# Patient Record
Sex: Female | Born: 1935
Health system: Southern US, Community
[De-identification: ages and names within clinical notes are randomized; demographics above are authoritative.]

## PROBLEM LIST (undated history)

## (undated) DIAGNOSIS — I10 Essential (primary) hypertension: Secondary | ICD-10-CM

## (undated) DIAGNOSIS — N189 Chronic kidney disease, unspecified: Secondary | ICD-10-CM

## (undated) DIAGNOSIS — D649 Anemia, unspecified: Secondary | ICD-10-CM

## (undated) DIAGNOSIS — E119 Type 2 diabetes mellitus without complications: Secondary | ICD-10-CM

## (undated) DIAGNOSIS — M109 Gout, unspecified: Secondary | ICD-10-CM

## (undated) DIAGNOSIS — K219 Gastro-esophageal reflux disease without esophagitis: Secondary | ICD-10-CM

## (undated) HISTORY — DX: Chronic kidney disease, unspecified: N18.9

## (undated) HISTORY — DX: Essential (primary) hypertension: I10

## (undated) HISTORY — DX: Gout, unspecified: M10.9

## (undated) HISTORY — PX: ROTATOR CUFF REPAIR: SHX139

## (undated) HISTORY — DX: Type 2 diabetes mellitus without complications: E11.9

## (undated) HISTORY — DX: Anemia, unspecified: D64.9

## (undated) HISTORY — PX: VAGINAL HYSTERECTOMY: SUR661

## (undated) HISTORY — DX: Gastro-esophageal reflux disease without esophagitis: K21.9

---

## 2005-08-25 ENCOUNTER — Ambulatory Visit: Payer: Self-pay | Admitting: Family Medicine

## 2006-06-20 ENCOUNTER — Emergency Department: Payer: Self-pay | Admitting: Emergency Medicine

## 2006-10-04 ENCOUNTER — Ambulatory Visit: Payer: Self-pay | Admitting: Family Medicine

## 2007-04-25 ENCOUNTER — Other Ambulatory Visit: Payer: Self-pay

## 2007-04-25 ENCOUNTER — Ambulatory Visit: Payer: Self-pay | Admitting: Ophthalmology

## 2007-05-02 ENCOUNTER — Ambulatory Visit: Payer: Self-pay | Admitting: Ophthalmology

## 2007-10-10 ENCOUNTER — Ambulatory Visit: Payer: Self-pay | Admitting: Family Medicine

## 2007-12-15 ENCOUNTER — Ambulatory Visit: Payer: Self-pay | Admitting: Internal Medicine

## 2007-12-19 ENCOUNTER — Ambulatory Visit: Payer: Self-pay | Admitting: Family Medicine

## 2007-12-20 ENCOUNTER — Ambulatory Visit: Payer: Self-pay | Admitting: Family Medicine

## 2007-12-22 ENCOUNTER — Ambulatory Visit: Payer: Self-pay | Admitting: Family Medicine

## 2008-01-23 ENCOUNTER — Ambulatory Visit: Payer: Self-pay | Admitting: Unknown Physician Specialty

## 2008-03-07 ENCOUNTER — Ambulatory Visit: Payer: Self-pay | Admitting: Unknown Physician Specialty

## 2008-03-15 ENCOUNTER — Ambulatory Visit: Payer: Self-pay | Admitting: Unknown Physician Specialty

## 2008-04-30 ENCOUNTER — Ambulatory Visit: Payer: Self-pay | Admitting: Family Medicine

## 2008-10-11 ENCOUNTER — Ambulatory Visit: Payer: Self-pay | Admitting: Family Medicine

## 2009-04-18 ENCOUNTER — Ambulatory Visit: Payer: Self-pay | Admitting: Family Medicine

## 2009-10-14 ENCOUNTER — Ambulatory Visit: Payer: Self-pay | Admitting: Family Medicine

## 2010-10-16 ENCOUNTER — Ambulatory Visit: Payer: Self-pay | Admitting: Family Medicine

## 2011-02-24 ENCOUNTER — Ambulatory Visit: Payer: Self-pay | Admitting: Unknown Physician Specialty

## 2011-10-20 ENCOUNTER — Ambulatory Visit: Payer: Self-pay | Admitting: Family Medicine

## 2012-10-22 ENCOUNTER — Inpatient Hospital Stay: Payer: Self-pay | Admitting: Internal Medicine

## 2012-10-22 LAB — CK TOTAL AND CKMB (NOT AT ARMC)
CK, Total: 108 U/L (ref 21–215)
CK-MB: 2 ng/mL (ref 0.5–3.6)

## 2012-10-22 LAB — CBC WITH DIFFERENTIAL/PLATELET
Basophil %: 0.5 %
Eosinophil #: 0.2 10*3/uL (ref 0.0–0.7)
HCT: 30.4 % — ABNORMAL LOW (ref 35.0–47.0)
HGB: 10.2 g/dL — ABNORMAL LOW (ref 12.0–16.0)
MCH: 29.9 pg (ref 26.0–34.0)
MCHC: 33.5 g/dL (ref 32.0–36.0)
MCV: 89 fL (ref 80–100)
Monocyte #: 0.9 x10 3/mm (ref 0.2–0.9)
Monocyte %: 8.3 %
Platelet: 197 10*3/uL (ref 150–440)
RDW: 13.5 % (ref 11.5–14.5)
WBC: 10.2 10*3/uL (ref 3.6–11.0)

## 2012-10-22 LAB — CBC
MCH: 29.7 pg (ref 26.0–34.0)
MCHC: 32.8 g/dL (ref 32.0–36.0)
Platelet: 190 10*3/uL (ref 150–440)
RBC: 3.89 10*6/uL (ref 3.80–5.20)
WBC: 9.7 10*3/uL (ref 3.6–11.0)

## 2012-10-22 LAB — BASIC METABOLIC PANEL
BUN: 28 mg/dL — ABNORMAL HIGH (ref 7–18)
Co2: 28 mmol/L (ref 21–32)
EGFR (African American): 27 — ABNORMAL LOW
Osmolality: 281 (ref 275–301)
Potassium: 3.6 mmol/L (ref 3.5–5.1)
Sodium: 137 mmol/L (ref 136–145)

## 2012-10-22 LAB — URINALYSIS, COMPLETE
Glucose,UR: NEGATIVE mg/dL (ref 0–75)
Hyaline Cast: 17
Nitrite: NEGATIVE
Protein: 30
RBC,UR: 13 /HPF (ref 0–5)
Specific Gravity: 1.025 (ref 1.003–1.030)
WBC UR: 17 /HPF (ref 0–5)

## 2012-10-22 LAB — APTT: Activated PTT: >160 secs (ref 23.6–35.9)

## 2012-10-22 LAB — COMPREHENSIVE METABOLIC PANEL
Alkaline Phosphatase: 78 U/L (ref 50–136)
Bilirubin,Total: 0.3 mg/dL (ref 0.2–1.0)
Calcium, Total: 10.1 mg/dL (ref 8.5–10.1)
Chloride: 105 mmol/L (ref 98–107)
Co2: 25 mmol/L (ref 21–32)
Creatinine: 1.96 mg/dL — ABNORMAL HIGH (ref 0.60–1.30)
EGFR (African American): 27 — ABNORMAL LOW
Osmolality: 285 (ref 275–301)
SGOT(AST): 24 U/L (ref 15–37)
SGPT (ALT): 18 U/L (ref 12–78)

## 2012-10-22 LAB — TROPONIN I: Troponin-I: 0.37 ng/mL — ABNORMAL HIGH

## 2012-10-22 LAB — MAGNESIUM: Magnesium: 1.4 mg/dL — ABNORMAL LOW

## 2012-10-23 LAB — CK TOTAL AND CKMB (NOT AT ARMC)
CK, Total: 165 U/L (ref 21–215)
CK-MB: 1.2 ng/mL (ref 0.5–3.6)

## 2012-10-23 LAB — APTT: Activated PTT: 83.9 secs — ABNORMAL HIGH (ref 23.6–35.9)

## 2012-10-23 LAB — TROPONIN I: Troponin-I: 0.54 ng/mL — ABNORMAL HIGH

## 2012-10-24 LAB — BASIC METABOLIC PANEL WITH GFR
Anion Gap: 9 (ref 7–16)
BUN: 30 mg/dL — ABNORMAL HIGH (ref 7–18)
Calcium, Total: 8.8 mg/dL (ref 8.5–10.1)
Chloride: 104 mmol/L (ref 98–107)
Co2: 25 mmol/L (ref 21–32)
Creatinine: 1.91 mg/dL — ABNORMAL HIGH (ref 0.60–1.30)
EGFR (African American): 28 — ABNORMAL LOW
EGFR (Non-African Amer.): 24 — ABNORMAL LOW
Glucose: 143 mg/dL — ABNORMAL HIGH (ref 65–99)
Osmolality: 284 (ref 275–301)
Potassium: 4.7 mmol/L (ref 3.5–5.1)
Sodium: 138 mmol/L (ref 136–145)

## 2012-10-24 LAB — PLATELET COUNT: Platelet: 183 x10 3/mm 3 (ref 150–440)

## 2012-10-24 LAB — APTT: Activated PTT: 86.1 secs — ABNORMAL HIGH (ref 23.6–35.9)

## 2012-10-24 LAB — URINE CULTURE

## 2012-10-28 LAB — CULTURE, BLOOD (SINGLE)

## 2014-06-21 DIAGNOSIS — E119 Type 2 diabetes mellitus without complications: Secondary | ICD-10-CM | POA: Insufficient documentation

## 2014-12-07 NOTE — Discharge Summary (Signed)
PATIENT NAME:  Laura [name] MR#:  [id number] DATE OF BIRTH:  1927/09/13  DATE OF ADMISSION:  10/22/2012 DATE OF DISCHARGE:  10/24/2012  DISCHARGE DIAGNOSES: Syncopal episode. Mild elevated troponin. No ischemia. Nuclear medicine stress test negative. Mild reduced ejection fraction as per stress test. No acute failure. Urinary tract infection.   PRIMARY CARDIOLOGIST: Laura MillardAlexander Paraschos, MD  HISTORY OF PRESENT ILLNESS: The patient is an 81 year old African American female with past medical history of hypertension, hyperlipidemia, diabetes, osteoarthritis, cataract surgery, rotator cuff repair, presented via EMS by having a brief syncopal episode, found to have troponin level of 0.37 in Emergency Room as per the patient, fully awake and able to give full history on presentation. In the morning, she woke up with mild headache. [facility], felt a little nauseated, and having first bite of her breakfast had a short vomiting episode and passed out for a few seconds. Did not have any presyncopal episode like this before. No blurry vision. No shortness of breath, chest pain, chest tightness. In ER, she was found having some inverted T waves in V1 to V2. Troponin was mildly elevated at 0.37 and she was admitted for further inpatient workup and management for her troponin.   HOSPITAL COURSE AND STAY: Because of her borderline elevated troponin and significant history, cardiology consult was called in. Cardiologist suggested stress test to be done next day morning. Her stress test came out negative and her troponin level remained stable, so she was discharged home with no additional medication. Other significant medical issues addressed during this hospital stay:  1.  Urinary tract infection: She was started on Rocephin and continued with that, remained stable.  2.  Syncopal episode: Possibly vasovagal or due to cardiac event. Her troponins were borderline elevated so  she was started on heparin drip, but then after having negative stress test, she was discharged home without any medications and attributed the syncopal episode to vasovagal event.  3.  Hypertension: Continued on metoprolol.  4.  Hyperlipidemia: Continued statin.  5.  Chronic kidney disease: No previous level available to compare over here. Heparin drip was continued and her BMP checked in the morning, which remained almost the same level. She said that she is following with a nephrologist for her kidney problems. 6.  Diabetes: Continued on sliding scale insulin coverage.   IMPORTANT LABORATORY RESULTS: Urinalysis: 17 WBC, 2+ lymphocyte esterase. Total WBC count 9.7, hemoglobin 11.6. Creatinine 1.96 on presentation. Troponin was 0.37 on presentation, which went to 0.29. Creatinine came to 1.91 after followup. Nuclear medicine stress test negative.   CONDITION ON DISCHARGE: Stable.   CODE STATUS ON DISCHARGE: Full code.   DISCHARGE MEDICATIONS: Estropipate 0.75 mg oral tablet once a day, ferrous sulfate 325 mg oral tablet once a day, ranitidine 300 mg oral tablet once a day, glipizide 10 mg oral tablet extended-release once a day, Mobic 15 mg oral tablet once a day, Januvia 100 mg oral tablet once a day, atorvastatin 10 mg oral tablet once a day, metoprolol 25 mg oral tablet 2 times a day, Ceftin 500 mg oral tablet 2 times a day for 3 days and amlodipine 10 mg once a day.   HOME HEALTH ON DISCHARGE: No.   DIET ON DISCHARGE: Low sodium, carbohydrate controlled ADA diet. Diet consistency regular.   TIMEFRAME TO FOLLOWUP: In 1 to 2 weeks with Dr. Marcina MillardAlexander [name].  TIME SPENT ON THIS DISCHARGE: 45 minutes.    ____________________________ Laura PigeonVaibhavkumar G.  Elisabeth Pigeon, MD vgv:jm D: 10/28/2012 22:53:55 ET T: 10/29/2012 14:57:34 ET JOB#: [id number]  cc: Laura [name]. Elisabeth Pigeon, MD, <Dictator> Laura Millard, MD Altamese Dilling MD ELECTRONICALLY SIGNED 11/14/2012 9:28

## 2014-12-07 NOTE — Consult Note (Signed)
Brief Consult Note: Diagnosis: Syncope, probable vasovagal, borderline elevated trop, no CP, probable demand supply ischemia.   Patient was seen by consultant.   Consult note dictated.   Comments: REC  Agree with current therapy, echo, Lexi-sest in am.  Electronic Signatures: Marcina MillardParaschos, Alexander (MD)  (Signed 09-Mar-14 13:29)  Authored: Brief Consult Note   Last Updated: 09-Mar-14 13:29 by Marcina MillardParaschos, Alexander (MD)

## 2014-12-07 NOTE — Consult Note (Signed)
PATIENT NAME:  Laura [name], Laura DibbleJOSEPHINE C MR#:  [id number] DATE OF BIRTH:  07/30/1928  DATE OF CONSULTATION:  10/23/2012  REFERRING PHYSICIAN:  Stephanie AcreVishal Mungal, MD  CONSULTING PHYSICIAN:  Marcina MillardAlexander Paraschos, MD PRIMARY CARE PHYSICIAN: Elizabeth Sauereanna Jones, MD      CHIEF COMPLAINT: "I passed out."   REASON FOR CONSULTATION: Consultation requested for evaluation of syncope and elevated troponin.     HISTORY OF PRESENT ILLNESS: The patient is an 81 year old female with history of hypertension who was admitted following a syncopal episode. The patient reports that she was in her usual state of health until yesterday while eating lunch the patient became slightly nauseous, diaphoretic and experienced an episode of loss of consciousness. The [facility] was [facility] to [facility] Emergency Room where EKG was nondiagnostic. Admission labs were notable for elevated BUN and creatinine at 26 and 1.96, respectively. Troponin was borderline elevated to 0.37. Urinalysis is positive for a urinary tract infection.   PAST MEDICAL HISTORY: 1.  Hypertension.  2.  Diabetes.  3.  Osteoarthritis.     HOME MEDICATIONS: Glipizide 10 mg daily, Januvia 100 mg daily, hydrochlorothiazide/lisinopril 12.5/10 daily, amlodipine 10 mg daily, ranitidine 300 mg daily, ferrous sulfate 325 mg daily.   SOCIAL HISTORY: The patient is a widow. She currently lives alone. She denies tobacco abuse.   FAMILY HISTORY: Mother with a history of heart disease.   REVIEW OF SYSTEMS:   CONSTITUTIONAL: No fever or chills.  EYES: No blurry vision.  EARS: No hearing loss.  RESPIRATORY: No shortness of breath.  CARDIOVASCULAR: No chest pain. The patient did have syncopal episode as described above.  GASTROINTESTINAL: The patient had nausea prior to syncopal episode, denies constipation,  diarrhea.  GENITOURINARY: No dysuria or hematuria.  ENDOCRINE: No polyuria or polydipsia.  MUSCULOSKELETAL: The patient does have bilateral knee pain.  NEUROLOGICAL: The patient  denies focal muscle weakness or numbness.  PSYCHOLOGICAL: No depression or anxiety.   PHYSICAL EXAMINATION: VITAL SIGNS: Blood pressure 120/61, pulse 62, respirations 16, temperature 98.2, pulse oximetry 100%.  HEENT: Pupils are equal, reactive to light and accommodation.  NECK: Supple without thyromegaly.  LUNGS: Clear.  HEART: Normal JVP. Normal PMI. Regular rate and rhythm. Normal S1, S2. No appreciable gallop, murmur, or rub.  ABDOMEN: Soft and nontender. Pulses were intact bilaterally.  MUSCULOSKELETAL: Normal muscle tone.  NEUROLOGICAL: The patient is alert and oriented x 3. Motor and sensory are both grossly intact.   IMPRESSION: An 81 year old female who presents with syncopal episode which sounds very much like vasovagal syncope with preceding nausea, lightheadedness, diaphoresis. The patient has remained hemodynamically stable, EKG is nondiagnostic. The patient has borderline elevated troponin in the setting of chronic renal insufficiency and probable UTI. I suspect this is probable demand/supply ischemia in the absence of chest pain or ECG changes.   RECOMMENDATIONS: 1.  I agree with overall current therapy.  2.  Continue heparin drip for now.  3.  A 2-D echocardiogram to evaluate left ventricular function.       4.  Lexiscan sestamibi study in the a.m.  ____________________________ Marcina MillardAlexander Paraschos, MD ap:cb D: 10/23/2012 13:26:59 ET T: 10/23/2012 13:36:04 ET JOB#: [id number]  cc: Marcina MillardAlexander Paraschos, MD, <Dictator> Marcina MillardALEXANDER PARASCHOS MD ELECTRONICALLY SIGNED 11/16/2012 12:25

## 2014-12-07 NOTE — H&P (Signed)
Past Med/Surgical Hx:  Diabetes Mellitus, Type II (NIDD):   Hypertension:   ALLERGIES:  No Known Allergies:    Assessment/Admission Diagnosis 81 yo female with PMhx of DM, HTN, HLD, OA, presented with syncopal episode and NSTEMI  1. NSTEMI - troponin x 1 =0.37 - cont with medical management (asa, beta blocker, statin, nitro SL, prn O2) - telemetry, CEx3, cont with heparin gtt - monitor blood pressure - cardiology consult  2. Vomiting - prn zofran  3. Syncope - first event, possible vasovagal  - check orthostatics  4. HTN   5. HLD  6. ? CKD - no previous labs to compare Cr to, currently on heparin gtt in D5W, will check BMP in the AM., and monitor creatinine.  7. DM - ssi  8. Home Meds - patient could not remember her home meds, advised her to have someone bring in her home meds.    FULL CODE PMD - Dr. Yetta BarreJones  Time spent evaluating patient = 45 minutes [id number]#[id number]   Electronic Signatures: Stephanie AcreMungal, Vishal (MD)  (Signed 08-Mar-14 17:15)  Authored: PAST MEDICAL/SURGIAL HISTORY, ALLERGIES, HOME MEDICATIONS, ASSESSMENT AND PLAN   Last Updated: 08-Mar-14 17:15 by Stephanie AcreMungal, Vishal (MD)

## 2014-12-07 NOTE — H&P (Signed)
PATIENT NAME:  [name], Laura DibbleJOSEPHINE [name] MR#:  [id number] DATE OF BIRTH:  [birth year]  DATE OF ADMISSION:  10/22/2012  INDICATION FOR ADMISSION: Non-STEMI.  CHIEF COMPLAINT: "I vomited and passed out."  ADMITTING PHYSICIAN: Dr. Stephanie AcreVishal Adryan [name].  PRIMARY CARE PHYSICIAN: Dr. Yetta [name].   EMERGENCY ROOM PHYSICIAN: Dr. Dellis AnesGallagher.   HISTORY OF PRESENT ILLNESS: The patient is an 81 year old African American female with past medical history of hypertension, hyperlipidemia, diabetes, osteoarthritis, history of cataract surgery, rotator cuff repair, [facility] short brief episode of syncope, found to have a troponin bump of 0.37 in the ED. History is per the patient. She is fully awake and able to give a full history. The patient states that this morning, she woke up with a mild headache. [facility] to [facility]. She felt a little nauseous after having the first bite of her breakfast. She had a short vomiting episode and then passed out for a few seconds. She said that she did not have any presyncopal episodes like this before. She did not have any blurry vision or have any shortness of breath, chest pain, chest tightness associated with this. She said the syncopal episode lasted for a few minutes. In the ED, she was found to have some flipped T waves in V1 through V2 on 2 separate EKGs. Her troponin was mildly elevated at 0.37. She was admitted for further inpatient workup and management of her troponin bump. The patient states she had [facility]. She cannot remember when. She states that she follows with a kidney doctor because she has some kidney disease. However, she is not on any form of dialysis. Hospitalist services were consulted for further inpatient workup and management. Her primary care physician is Dr. Yetta [name].   PAST MEDICAL HISTORY: Hypertension, diabetes, questionable CKD, osteoarthritis.  PAST SURGICAL  HISTORY: Cataract surgery, left rotator cuff repair a number of years ago.  HOME MEDICATIONS: She did not bring her medications with her and cannot remember.  ALLERGIES: No known drug allergies.   FAMILY HISTORY: Mother had heart disease and died.   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   REVIEW OF SYSTEMS: CONSTITUTIONAL: No fever, fatigue, weakness. No chest pain. No weight loss or weight gain.  EYES: No blurred vision, double vision, pain, redness, inflammation. Positive cataracts.  ENT: No tinnitus, ear pain, hearing loss. No seasonal allergies.  RESPIRATORY: No cough, wheeze, hemoptysis, COPD, asthma, TB, pneumonia.   CARDIOVASCULAR: No chest pain, orthopnea, edema, arrhythmia, dyspnea on exertion.  GASTROINTESTINAL: Nausea and vomiting. No diarrhea. No abdominal pain, hematemesis, melena or ulcers.  GENITOURINARY: No dysuria, hematuria or renal calculi.  ENDOCRINE: No polyuria, nocturia or thyroid problems.  HEMATOLOGIC AND LYMPHATIC: No anemia, easy bruising, bleeding or swollen glands.  INTEGUMENTARY: No acne, rash, change in lesions, moles or skin.  MUSCULOSKELETAL: No acute pain in neck, back, shoulder, knee, hip arthritis. Positive numbness.  NEUROLOGIC: No weakness, dysarthria, epilepsy, tremor or vertigo.  PSYCHIATRIC: No anxiety, insomnia, ADD, OCD, bipolar or depression.   PHYSICAL EXAMINATION:  VITAL SIGNS: In the ED, blood pressure 142/53, temperature 98.2, pulse ox 100% on room air, pulse 81.  GENERAL APPEARANCE: Well-developed, well-nourished female lying in bed in no acute respiratory distress.  HEENT: PERRLA, EOMI. No scleral icterus or conjunctivitis. No difficulty hearing.  NECK: No thyroid enlargement, nodules or  tenderness. Neck is supple and nontender.  RESPIRATORY: Clear to auscultation bilaterally. No rales, rhonchi, crackles or diminished breath sounds. No wheezes noted.   CARDIOVASCULAR: Regular rate and regular rhythm. No murmurs. S1, S2 auscultated. No PMI lateralization. No lower extremity edema.  ABDOMEN: Soft, nontender, nondistended. Positive bowel sounds.  MUSCULOSKELETAL: 4/5 strength bilateral upper and lower extremities. No cyanosis or DJD.  SKIN: No rash, lesions, erythema, nodules. Skin is warm and dry.  LYMPHATIC: No adenopathy noted in cervical, axilla or supraclavicular regions.  NEUROLOGIC: Cranial nerves II through XII intact. Deep tendon reflexes intact. Sensory is intact.  PSYCHIATRIC: Alert and oriented to person, time and place. Cooperative. Good judgment is noted.   PERTINENT LABORATORY, DIAGNOSTIC AND RADIOLOGIC DATA: Sodium 137, potassium 3.9, chloride 105, bicarbonate 25, BUN 26, creatinine 1.96, glucose 208. Troponin is elevated at 0.37. She did have a urinalysis that showed 1+ bacteria, 17 white blood cells, nitrite negative, 2+ leukocyte esterase, specific gravity of 1.025. X-ray showed no acute cardiopulmonary process. EKG shows normal sinus rhythm, rate of about 75 to 80, inverted T waves in V1, 2 and 3. Repeat EKG 4 hours later showed no acute changes. The patient does not complain of chest pain.   ASSESSMENT AND PLAN: An 81 year old female with past medical history of diabetes, hypertension, hyperlipidemia, osteoarthritis, presenting with syncopal episode, found to have a non-ST elevation myocardial infarction.  1.  Non-ST elevation myocardial infarction. Troponin x 1 is 0.37. Continue medical management with aspirin, beta blocker, statin, nitroglycerin sublingual, and as-needed oxygen. Telemetry floor. Check cardiac enzymes x 3. Continue with the heparin drip started by the Emergency Department. Monitor her blood pressure. Will also consult cardiology in the morning. I believe this is more likely a demand ischemia. She said her blood pressure has been running low to high over the past couple days. In the Emergency Department, her blood  pressure was noted to be moderately elevated into the 140s to 150s.  2.  Vomiting secondary to #1. Will put her on as-needed Zofran.  3.  Syncope. This is a first event. She does not remember syncopizing in the past before. This is possibly vasovagal or related to the non-ST elevation myocardial infarction. Will check orthostatics also.  4.  Hypertension. Continue with home medications once we get the list available.  5.  Hyperlipidemia. Continue with statin therapy.   CODE STATUS: Full code.   TIME SPENT DICTATING AND EVALUATING PATIENT: 45 minutes.    ____________________________ Stephanie Acre, MD vm:jm D: 10/22/2012 17:10:33 ET T: 10/22/2012 17:55:45 ET JOB#: [id number]  cc: Stephanie Acre, MD, <Dictator> Stephanie Acre MD ELECTRONICALLY SIGNED 10/22/2012 23:50

## 2015-02-20 ENCOUNTER — Other Ambulatory Visit: Payer: Self-pay | Admitting: Family Medicine

## 2015-02-20 DIAGNOSIS — I10 Essential (primary) hypertension: Secondary | ICD-10-CM

## 2015-02-21 ENCOUNTER — Ambulatory Visit (INDEPENDENT_AMBULATORY_CARE_PROVIDER_SITE_OTHER): Payer: Medicare Other | Admitting: Family Medicine

## 2015-02-21 ENCOUNTER — Encounter: Payer: Self-pay | Admitting: Family Medicine

## 2015-02-21 VITALS — BP 120/60 | HR 60 | Ht 62.0 in | Wt 151.0 lb

## 2015-02-21 DIAGNOSIS — D649 Anemia, unspecified: Secondary | ICD-10-CM | POA: Insufficient documentation

## 2015-02-21 DIAGNOSIS — I10 Essential (primary) hypertension: Secondary | ICD-10-CM | POA: Insufficient documentation

## 2015-02-21 DIAGNOSIS — Z8669 Personal history of other diseases of the nervous system and sense organs: Secondary | ICD-10-CM | POA: Insufficient documentation

## 2015-02-21 DIAGNOSIS — Z09 Encounter for follow-up examination after completed treatment for conditions other than malignant neoplasm: Secondary | ICD-10-CM | POA: Insufficient documentation

## 2015-02-21 DIAGNOSIS — K219 Gastro-esophageal reflux disease without esophagitis: Secondary | ICD-10-CM | POA: Insufficient documentation

## 2015-02-21 DIAGNOSIS — Z23 Encounter for immunization: Secondary | ICD-10-CM | POA: Insufficient documentation

## 2015-02-21 DIAGNOSIS — L659 Nonscarring hair loss, unspecified: Secondary | ICD-10-CM

## 2015-02-21 DIAGNOSIS — E119 Type 2 diabetes mellitus without complications: Secondary | ICD-10-CM | POA: Insufficient documentation

## 2015-02-21 DIAGNOSIS — T783XXA Angioneurotic edema, initial encounter: Secondary | ICD-10-CM | POA: Insufficient documentation

## 2015-02-21 DIAGNOSIS — Z780 Asymptomatic menopausal state: Secondary | ICD-10-CM | POA: Insufficient documentation

## 2015-02-21 DIAGNOSIS — E7849 Other hyperlipidemia: Secondary | ICD-10-CM | POA: Insufficient documentation

## 2015-02-21 DIAGNOSIS — R55 Syncope and collapse: Secondary | ICD-10-CM | POA: Insufficient documentation

## 2015-02-21 DIAGNOSIS — M109 Gout, unspecified: Secondary | ICD-10-CM | POA: Insufficient documentation

## 2015-02-21 DIAGNOSIS — I872 Venous insufficiency (chronic) (peripheral): Secondary | ICD-10-CM | POA: Insufficient documentation

## 2015-02-21 NOTE — Progress Notes (Signed)
Name: Laura [name]   MRN: [id number]    DOB: May 19, 1928   Date:02/21/2015       Progress Note  Subjective  Chief Complaint  Chief Complaint  Patient presents with  . Hair/Scalp Problem    hair is breaking off    HPI Comments: Pt with alopecia onset six months ago.  After cutting short started pruritis and hair breakage. Denies fatigue temp intolerance.  recentlu stopped hormone replacement.  Thyroid Problem Symptoms include hair loss and heat intolerance. Patient reports no anxiety, cold intolerance, constipation, depressed mood, diarrhea, dry skin, fatigue, hoarse voice, leg swelling, menstrual problem, nail problem, palpitations, tremors, visual change, weight gain or weight loss. The symptoms have been resolved. Past treatments include nothing.    No problem-specific assessment & plan notes found for this encounter.   Past Medical History  Diagnosis Date  . GERD (gastroesophageal reflux disease)   . Hypertension   . Diabetes mellitus without complication   . Anemia   . Chronic kidney disease   . Gout     Past Surgical History  Procedure Laterality Date  . Vaginal hysterectomy    . Rotator cuff repair Left     Family History  Problem Relation Age of Onset  . Diabetes Mother   . Cancer Sister     History   Social History  . Marital Status: Widowed    Spouse Name: N/A  . Number of Children: N/A  . Years of Education: N/A   Occupational History  . Not on file.   Social History Main Topics  . Smoking status: Never Smoker   . Smokeless tobacco: Not on file  . Alcohol Use: No  . Drug Use: No  . Sexual Activity: Not Currently   Other Topics Concern  . Not on file   Social History Narrative  . No narrative on file    Allergies  Allergen Reactions  . Angiotensin Receptor Blockers      Review of Systems  Constitutional: Negative for fever, chills, weight loss, weight gain, malaise/fatigue and fatigue.  HENT: Negative for ear discharge, ear pain,  hoarse voice and sore throat.   Eyes: Negative for blurred vision.  Respiratory: Negative for cough, sputum production, shortness of breath and wheezing.   Cardiovascular: Negative for chest pain, palpitations and leg swelling.  Gastrointestinal: Negative for heartburn, nausea, abdominal pain, diarrhea, constipation, blood in stool and melena.  Genitourinary: Negative for dysuria, urgency, frequency, hematuria and menstrual problem.  Musculoskeletal: Negative for myalgias, back pain, joint pain and neck pain.  Skin: Negative for rash.  Neurological: Negative for dizziness, tingling, tremors, sensory change, focal weakness and headaches.  Endo/Heme/Allergies: Positive for heat intolerance. Negative for environmental allergies, cold intolerance and polydipsia. Does not bruise/bleed easily.  Psychiatric/Behavioral: Negative for depression and suicidal ideas. The patient is not nervous/anxious and does not have insomnia.      Objective  Filed Vitals:   02/21/15 0927  BP: 120/60  Pulse: 60  Height:  (1.575 m)  Weight: 151 lb (68.493 kg)    Physical Exam  Constitutional: She is well-developed, well-nourished, and in no distress. No distress.  HENT:  Head: Normocephalic and atraumatic.  Right Ear: External ear normal.  Left Ear: External ear normal.  Nose: Nose normal.  Mouth/Throat: Oropharynx is clear and moist.  Eyes: Conjunctivae and EOM are normal. Pupils are equal, round, and reactive to light. Right eye exhibits no discharge. Left eye exhibits no discharge.  Neck: Normal range of motion. Neck supple.  No JVD present. No thyromegaly present.  Cardiovascular: Normal rate, regular rhythm, normal heart sounds and intact distal pulses.  Exam reveals no gallop and no friction rub.   No murmur heard. Pulmonary/Chest: Effort normal and breath sounds normal.  Abdominal: Soft. Bowel sounds are normal. She exhibits no mass. There is no tenderness. There is no guarding.   Musculoskeletal: Normal range of motion. She exhibits no edema or tenderness.  Lymphadenopathy:    She has no cervical adenopathy.  Neurological: She is alert. She has normal reflexes.  Skin: Skin is warm, dry and intact. She is not diaphoretic. No cyanosis. Nails show no clubbing.  Psychiatric: Mood and affect normal.      Assessment & Plan  Problem List Items Addressed This Visit    None    Visit Diagnoses    Alopecia    -  Primary    Relevant Orders    TSH    Ambulatory referral to Dermatology         Dr. Elizabeth Sauereanna Jones [facility]  02/21/2015

## 2015-02-22 LAB — TSH: TSH: 4.36 u[IU]/mL (ref 0.450–4.500)

## 2015-06-05 ENCOUNTER — Other Ambulatory Visit: Payer: Self-pay | Admitting: Family Medicine

## 2015-06-11 ENCOUNTER — Other Ambulatory Visit: Payer: Self-pay | Admitting: Family Medicine

## 2015-07-16 ENCOUNTER — Other Ambulatory Visit: Payer: Self-pay | Admitting: Family Medicine

## 2015-08-04 ENCOUNTER — Other Ambulatory Visit: Payer: Self-pay | Admitting: Family Medicine

## 2015-08-12 ENCOUNTER — Other Ambulatory Visit: Payer: Self-pay | Admitting: Family Medicine

## 2015-08-26 ENCOUNTER — Other Ambulatory Visit: Payer: Self-pay | Admitting: Family Medicine

## 2015-09-02 ENCOUNTER — Other Ambulatory Visit: Payer: Self-pay | Admitting: Family Medicine

## 2015-09-18 ENCOUNTER — Other Ambulatory Visit: Payer: Self-pay | Admitting: Family Medicine

## 2015-11-15 ENCOUNTER — Other Ambulatory Visit: Payer: Self-pay | Admitting: Family Medicine

## 2015-11-23 ENCOUNTER — Other Ambulatory Visit: Payer: Self-pay | Admitting: Family Medicine

## 2015-12-26 ENCOUNTER — Other Ambulatory Visit: Payer: Self-pay | Admitting: Family Medicine

## 2016-01-11 ENCOUNTER — Other Ambulatory Visit: Payer: Self-pay | Admitting: Family Medicine

## 2016-01-16 ENCOUNTER — Encounter: Payer: Self-pay | Admitting: Family Medicine

## 2016-01-16 ENCOUNTER — Ambulatory Visit (INDEPENDENT_AMBULATORY_CARE_PROVIDER_SITE_OTHER): Payer: Medicare Other | Admitting: Family Medicine

## 2016-01-16 VITALS — BP 120/60 | HR 80 | Ht 62.0 in | Wt 148.0 lb

## 2016-01-16 DIAGNOSIS — K219 Gastro-esophageal reflux disease without esophagitis: Secondary | ICD-10-CM | POA: Diagnosis not present

## 2016-01-16 DIAGNOSIS — I10 Essential (primary) hypertension: Secondary | ICD-10-CM | POA: Diagnosis not present

## 2016-01-16 DIAGNOSIS — Z23 Encounter for immunization: Secondary | ICD-10-CM

## 2016-01-16 MED ORDER — AMLODIPINE BESYLATE 10 MG PO TABS: 10.0000 mg | ORAL_TABLET | Freq: Every day | ORAL | Status: DC

## 2016-01-16 MED ORDER — HYDROCHLOROTHIAZIDE 25 MG PO TABS: 25.0000 mg | ORAL_TABLET | Freq: Every day | ORAL | Status: DC

## 2016-01-16 MED ORDER — RANITIDINE HCL 300 MG PO TABS: 300.0000 mg | ORAL_TABLET | Freq: Every day | ORAL | Status: DC

## 2016-01-16 MED ORDER — METOPROLOL TARTRATE 25 MG PO TABS: ORAL_TABLET | ORAL | Status: DC

## 2016-01-16 NOTE — Progress Notes (Signed)
Name: Laura [name]   MRN: [id number]    DOB: 1928/05/15   Date:01/16/2016       Progress Note  Subjective  Chief Complaint  Chief Complaint  Patient presents with  . Hypertension  . Gastroesophageal Reflux    Hypertension This is a chronic problem. The current episode started more than 1 year ago. The problem has been gradually improving since onset. The problem is controlled. Pertinent negatives include no anxiety, blurred vision, chest pain, headaches, malaise/fatigue, neck pain, orthopnea, palpitations, peripheral edema, PND, shortness of breath or sweats. There are no associated agents to hypertension. Risk factors for coronary artery disease include dyslipidemia, diabetes mellitus and obesity. Past treatments include beta blockers, calcium channel blockers and diuretics. The current treatment provides moderate improvement. There are no compliance problems.  There is no history of angina, kidney disease, CAD/MI, CVA, heart failure, left ventricular hypertrophy, PVD, renovascular disease or retinopathy. There is no history of chronic renal disease or a hypertension causing med.  Gastroesophageal Reflux She reports no abdominal pain, no belching, no chest pain, no choking, no coughing, no dysphagia, no early satiety, no globus sensation, no heartburn, no hoarse voice, no nausea, no sore throat, no stridor or no wheezing. This is a chronic problem. The current episode started more than 1 year ago. The problem occurs occasionally. The problem has been gradually improving. Nothing aggravates the symptoms. Pertinent negatives include no anemia, fatigue, melena, muscle weakness, orthopnea or weight loss. Risk factors include NSAIDs. She has tried nothing for the symptoms. The treatment provided moderate relief.    No problem-specific assessment & plan notes found for this encounter.   Past Medical History  Diagnosis Date  . GERD (gastroesophageal reflux disease)   . Hypertension   .  Diabetes mellitus without complication (HCC)   . Anemia   . Chronic kidney disease   . Gout     Past Surgical History  Procedure Laterality Date  . Vaginal hysterectomy    . Rotator cuff repair Left     Family History  Problem Relation Age of Onset  . Diabetes Mother   . Cancer Sister     Social History   Social History  . Marital Status: Widowed    Spouse Name: N/A  . Number of Children: N/A  . Years of Education: N/A   Occupational History  . Not on file.   Social History Main Topics  . Smoking status: Never Smoker   . Smokeless tobacco: Not on file  . Alcohol Use: No  . Drug Use: No  . Sexual Activity: Not Currently   Other Topics Concern  . Not on file   Social History Narrative    Allergies  Allergen Reactions  . Angiotensin Receptor Blockers      Review of Systems  Constitutional: Negative for weight loss, malaise/fatigue and fatigue.  HENT: Negative for hoarse voice and sore throat.   Eyes: Negative for blurred vision.  Respiratory: Negative for cough, choking, shortness of breath and wheezing.   Cardiovascular: Negative for chest pain, palpitations, orthopnea and PND.  Gastrointestinal: Negative for heartburn, dysphagia, nausea, abdominal pain and melena.  Musculoskeletal: Negative for muscle weakness and neck pain.  Neurological: Negative for headaches.     Objective  Filed Vitals:   01/16/16 0922  BP: 120/60  Pulse: 80  Height: 5\' 2"  (1.575 m)  Weight: 148 lb (67.132 kg)    Physical Exam  Constitutional: She is well-developed, well-nourished, and in no distress. No distress.  HENT:  Head: Normocephalic and atraumatic.  Right Ear: External ear normal.  Left Ear: External ear normal.  Nose: Nose normal.  Mouth/Throat: Oropharynx is clear and moist.  Eyes: Conjunctivae and EOM are normal. Pupils are equal, round, and reactive to light. Right eye exhibits no discharge. Left eye exhibits no discharge.  Neck: Normal range of motion.  Neck supple. No JVD present. No thyromegaly present.  Cardiovascular: Normal rate, regular rhythm, normal heart sounds and intact distal pulses.  Exam reveals no gallop and no friction rub.   No murmur heard. Pulmonary/Chest: Effort normal and breath sounds normal.  Abdominal: Soft. Bowel sounds are normal. She exhibits no mass. There is no tenderness. There is no guarding.  Musculoskeletal: Normal range of motion. She exhibits no edema.  Lymphadenopathy:    She has no cervical adenopathy.  Neurological: She is alert. She has normal reflexes.  Skin: Skin is warm and dry. She is not diaphoretic.  Psychiatric: Mood, memory, affect and judgment normal.  Nursing note and vitals reviewed.     Assessment & Plan  Problem List Items Addressed This Visit      Cardiovascular and Mediastinum   Essential (primary) hypertension - Primary   Relevant Medications   amLODipine (NORVASC) 10 MG tablet   hydrochlorothiazide (HYDRODIURIL) 25 MG tablet   metoprolol tartrate (LOPRESSOR) 25 MG tablet   Other Relevant Orders   Renal Function Panel     Digestive   Gastro-esophageal reflux disease without esophagitis   Relevant Medications   ranitidine (ZANTAC) 300 MG tablet    Other Visit Diagnoses    Need for Tdap vaccination        Relevant Orders    Tdap vaccine greater than or equal to 7yo IM (Completed)    Need for pneumococcal vaccination        Relevant Orders    Pneumococcal conjugate vaccine 13-valent (Completed)         Dr. Hay[facility]  01/16/2016

## 2016-01-18 LAB — RENAL FUNCTION PANEL
Albumin: 3.8 g/dL (ref 3.5–4.7)
BUN/Creatinine Ratio: 17 (ref 12–28)
BUN: 37 mg/dL — AB (ref 8–27)
CALCIUM: 9.3 mg/dL (ref 8.7–10.3)
CO2: 23 mmol/L (ref 18–29)
CREATININE: 2.13 mg/dL — AB (ref 0.57–1.00)
Chloride: 100 mmol/L (ref 96–106)
GFR calc Af Amer: 24 mL/min/{1.73_m2} — ABNORMAL LOW (ref >59)
GFR calc non Af Amer: 20 mL/min/{1.73_m2} — ABNORMAL LOW (ref >59)
GLUCOSE: 107 mg/dL — AB (ref 65–99)
PHOSPHORUS: 3.3 mg/dL (ref 2.5–4.5)
Potassium: 4.7 mmol/L (ref 3.5–5.2)
Sodium: 139 mmol/L (ref 134–144)

## 2016-01-21 ENCOUNTER — Other Ambulatory Visit: Payer: Self-pay | Admitting: Family Medicine

## 2016-08-27 ENCOUNTER — Other Ambulatory Visit: Payer: Self-pay | Admitting: Family Medicine

## 2016-08-27 DIAGNOSIS — I10 Essential (primary) hypertension: Secondary | ICD-10-CM

## 2016-09-23 ENCOUNTER — Other Ambulatory Visit: Payer: Self-pay | Admitting: Family Medicine

## 2016-09-23 DIAGNOSIS — I10 Essential (primary) hypertension: Secondary | ICD-10-CM

## 2016-10-09 ENCOUNTER — Other Ambulatory Visit: Payer: Self-pay | Admitting: Family Medicine

## 2016-10-09 DIAGNOSIS — I10 Essential (primary) hypertension: Secondary | ICD-10-CM

## 2016-10-29 ENCOUNTER — Other Ambulatory Visit: Payer: Self-pay | Admitting: Family Medicine

## 2016-10-29 DIAGNOSIS — I10 Essential (primary) hypertension: Secondary | ICD-10-CM

## 2016-11-03 ENCOUNTER — Ambulatory Visit (INDEPENDENT_AMBULATORY_CARE_PROVIDER_SITE_OTHER): Payer: Medicare Other | Admitting: Family Medicine

## 2016-11-03 ENCOUNTER — Encounter: Payer: Self-pay | Admitting: Family Medicine

## 2016-11-03 VITALS — BP 130/62 | HR 64 | Ht 62.0 in | Wt 147.0 lb

## 2016-11-03 DIAGNOSIS — I10 Essential (primary) hypertension: Secondary | ICD-10-CM

## 2016-11-03 DIAGNOSIS — K219 Gastro-esophageal reflux disease without esophagitis: Secondary | ICD-10-CM | POA: Diagnosis not present

## 2016-11-03 DIAGNOSIS — E784 Other hyperlipidemia: Secondary | ICD-10-CM

## 2016-11-03 DIAGNOSIS — M109 Gout, unspecified: Secondary | ICD-10-CM | POA: Diagnosis not present

## 2016-11-03 DIAGNOSIS — E7849 Other hyperlipidemia: Secondary | ICD-10-CM

## 2016-11-03 MED ORDER — HYDROCHLOROTHIAZIDE 12.5 MG PO TABS: 12.5000 mg | ORAL_TABLET | Freq: Every day | ORAL | 0 refills | Status: DC

## 2016-11-03 MED ORDER — RANITIDINE HCL 300 MG PO TABS: 300.0000 mg | ORAL_TABLET | Freq: Every day | ORAL | 1 refills | Status: DC

## 2016-11-03 MED ORDER — AMLODIPINE BESYLATE 5 MG PO TABS: 5.0000 mg | ORAL_TABLET | Freq: Every day | ORAL | 0 refills | Status: DC

## 2016-11-03 MED ORDER — METOPROLOL TARTRATE 25 MG PO TABS: 25.0000 mg | ORAL_TABLET | Freq: Two times a day (BID) | ORAL | 1 refills | Status: DC

## 2016-11-03 NOTE — Progress Notes (Signed)
Name: Laura [name]   MRN: [id number]    DOB: 1928/05/29   Date:11/03/2016       Progress Note  Subjective  Chief Complaint  Chief Complaint  Patient presents with  . Gastroesophageal Reflux  . Hypertension  . Gout    Patient needs refill of allopurinol for gout prophylaxis.  No episodes of acute gout,   Gastroesophageal Reflux  She complains of heartburn. She reports no abdominal pain, no belching, no chest pain, no choking, no coughing, no dysphagia, no early satiety, no globus sensation, no hoarse voice, no nausea, no sore throat or no wheezing. This is a chronic problem. The current episode started more than 1 year ago. The problem occurs occasionally. The problem has been waxing and waning. The heartburn is of mild intensity. The heartburn does not wake her from sleep. The symptoms are aggravated by certain foods (spagetti). Pertinent negatives include no fatigue, melena or weight loss. Risk factors include NSAIDs (meloxicam). She has tried a histamine-2 antagonist for the symptoms. The treatment provided mild relief.  Hypertension  This is a chronic problem. The current episode started more than 1 year ago. The problem has been waxing and waning since onset. The problem is controlled. Pertinent negatives include no anxiety, blurred vision, chest pain, headaches, malaise/fatigue, neck pain, orthopnea, palpitations, peripheral edema, PND, shortness of breath or sweats. Agents associated with hypertension include NSAIDs. There are no known risk factors for coronary artery disease. Past treatments include beta blockers, calcium channel blockers and diuretics (qod diuretuc). The current treatment provides mild improvement. There are no compliance problems.  Hypertensive end-organ damage includes kidney disease. There is no history of angina, CAD/MI, CVA, heart failure, left ventricular hypertrophy, PVD or retinopathy. Identifiable causes of hypertension include renovascular disease. There is  no history of chronic renal disease or a hypertension causing med.    No problem-specific Assessment & Plan notes found for this encounter.   Past Medical History:  Diagnosis Date  . Anemia   . Chronic kidney disease   . Diabetes mellitus without complication (HCC)   . GERD (gastroesophageal reflux disease)   . Gout   . Hypertension     Past Surgical History:  Procedure Laterality Date  . ROTATOR CUFF REPAIR Left   . VAGINAL HYSTERECTOMY      Family History  Problem Relation Age of Onset  . Diabetes Mother   . Cancer Sister     Social History   Social History  . Marital status: Widowed    Spouse name: N/A  . Number of children: N/A  . Years of education: N/A   Occupational History  . Not on file.   Social History Main Topics  . Smoking status: Never Smoker  . Smokeless tobacco: Not on file  . Alcohol use No  . Drug use: No  . Sexual activity: Not Currently   Other Topics Concern  . Not on file   Social History Narrative  . No narrative on file    Allergies  Allergen Reactions  . Angiotensin Receptor Blockers     Outpatient Medications Prior to Visit  Medication Sig Dispense Refill  . ferrous sulfate 325 (65 FE) MG tablet Take 1 tablet by mouth daily. otc    . glimepiride (AMARYL) 4 MG tablet Take 1 tablet by mouth daily. Dr Renae Fickle    . meloxicam [facility]) 15 MG tablet Dedra Skeens    . pioglitazone (ACTOS) 15 MG tablet Take 1 tablet by mouth daily. Dr Renae Fickle    .  amLODipine (NORVASC) 10 MG tablet TAKE 1 TABLET BY MOUTH EVERY DAY 30 tablet 5  . hydrochlorothiazide (HYDRODIURIL) 25 MG tablet Take 1 tablet (25 mg total) by mouth daily. 30 tablet 6  . metoprolol tartrate (LOPRESSOR) 25 MG tablet TAKE 1 TABLET BY MOUTH TWICE DAILY 15 tablet 0  . ranitidine (ZANTAC) 300 MG tablet Take 1 tablet (300 mg total) by mouth at bedtime. 15 tablet 6  . amLODipine (NORVASC) 10 MG tablet TAKE 1 TABLET(10 MG) BY MOUTH DAILY 30 tablet 0   No facility-administered  medications prior to visit.     Review of Systems  Constitutional: Negative for chills, fatigue, fever, malaise/fatigue and weight loss.  HENT: Negative for ear discharge, ear pain, hoarse voice and sore throat.   Eyes: Negative for blurred vision.  Respiratory: Negative for cough, sputum production, choking, shortness of breath and wheezing.   Cardiovascular: Negative for chest pain, palpitations, orthopnea, leg swelling and PND.  Gastrointestinal: Positive for heartburn. Negative for abdominal pain, blood in stool, constipation, diarrhea, dysphagia, melena and nausea.  Genitourinary: Negative for dysuria, frequency, hematuria and urgency.  Musculoskeletal: Negative for back pain, joint pain, myalgias and neck pain.  Skin: Negative for rash.  Neurological: Negative for dizziness, tingling, sensory change, focal weakness and headaches.  Endo/Heme/Allergies: Negative for environmental allergies and polydipsia. Does not bruise/bleed easily.  Psychiatric/Behavioral: Negative for depression and suicidal ideas. The patient is not nervous/anxious and does not have insomnia.      Objective  Vitals:   11/03/16 0827  BP: 130/62  Pulse: 64  Weight: 147 lb (66.7 kg)  Height: 5\' 2"  (1.575 m)    Physical Exam  Constitutional: She is well-developed, well-nourished, and in no distress. No distress.  HENT:  Head: Normocephalic and atraumatic.  Right Ear: External ear normal.  Left Ear: External ear normal.  Nose: Nose normal.  Mouth/Throat: Oropharynx is clear and moist.  Eyes: Conjunctivae and EOM are normal. Pupils are equal, round, and reactive to light. Right eye exhibits no discharge. Left eye exhibits no discharge.  Neck: Normal range of motion. Neck supple. No JVD present. No thyromegaly present.  Cardiovascular: Normal rate, regular rhythm, normal heart sounds and intact distal pulses.  Exam reveals no gallop and no friction rub.   No murmur heard. Pulmonary/Chest: Effort normal and  breath sounds normal. She has no wheezes. She has no rales.  Abdominal: Soft. Bowel sounds are normal. She exhibits no mass. There is no tenderness. There is no guarding.  Musculoskeletal: Normal range of motion. She exhibits no edema.  Lymphadenopathy:    She has no cervical adenopathy.  Neurological: She is alert. She has normal reflexes.  Skin: Skin is warm and dry. She is not diaphoretic.  Psychiatric: Mood and affect normal.  Nursing note and vitals reviewed.     Assessment & Plan  Problem List Items Addressed This Visit      Cardiovascular and Mediastinum   Essential (primary) hypertension - Primary   Relevant Medications   amLODipine (NORVASC) 5 MG tablet   hydrochlorothiazide (HYDRODIURIL) 12.5 MG tablet   metoprolol tartrate (LOPRESSOR) 25 MG tablet     Digestive   Gastro-esophageal reflux disease without esophagitis   Relevant Medications   ranitidine (ZANTAC) 300 MG tablet     Musculoskeletal and Integument   Arthritis due to gout     Other   Familial multiple lipoprotein-type hyperlipidemia   Relevant Medications   amLODipine (NORVASC) 5 MG tablet   hydrochlorothiazide (HYDRODIURIL) 12.5 MG tablet  metoprolol tartrate (LOPRESSOR) 25 MG tablet      Meds ordered this encounter  Medications  . amLODipine (NORVASC) 5 MG tablet    Sig: Take 1 tablet (5 mg total) by mouth daily.    Dispense:  90 tablet    Refill:  0  . hydrochlorothiazide (HYDRODIURIL) 12.5 MG tablet    Sig: Take 1 tablet (12.5 mg total) by mouth daily.    Dispense:  90 tablet    Refill:  0  . ranitidine (ZANTAC) 300 MG tablet    Sig: Take 1 tablet (300 mg total) by mouth at bedtime.    Dispense:  90 tablet    Refill:  1    sched appt  . metoprolol tartrate (LOPRESSOR) 25 MG tablet    Sig: Take 1 tablet (25 mg total) by mouth 2 (two) times daily.    Dispense:  180 tablet    Refill:  1      Dr. Elizabeth Sauer [facility]  11/03/16

## 2016-12-01 ENCOUNTER — Other Ambulatory Visit: Payer: Self-pay

## 2016-12-15 ENCOUNTER — Ambulatory Visit (INDEPENDENT_AMBULATORY_CARE_PROVIDER_SITE_OTHER): Payer: Medicare Other | Admitting: Family Medicine

## 2016-12-15 ENCOUNTER — Encounter: Payer: Self-pay | Admitting: Family Medicine

## 2016-12-15 VITALS — BP 120/72 | HR 68 | Resp 12 | Ht 62.0 in | Wt 144.0 lb

## 2016-12-15 DIAGNOSIS — R4182 Altered mental status, unspecified: Secondary | ICD-10-CM | POA: Diagnosis not present

## 2016-12-15 DIAGNOSIS — E118 Type 2 diabetes mellitus with unspecified complications: Secondary | ICD-10-CM

## 2016-12-15 DIAGNOSIS — K219 Gastro-esophageal reflux disease without esophagitis: Secondary | ICD-10-CM

## 2016-12-15 DIAGNOSIS — E790 Hyperuricemia without signs of inflammatory arthritis and tophaceous disease: Secondary | ICD-10-CM

## 2016-12-15 DIAGNOSIS — I10 Essential (primary) hypertension: Secondary | ICD-10-CM

## 2016-12-15 LAB — POCT URINALYSIS DIPSTICK
BILIRUBIN UA: NEGATIVE
Blood, UA: NEGATIVE
GLUCOSE UA: NEGATIVE
Ketones, UA: NEGATIVE
LEUKOCYTES UA: NEGATIVE
NITRITE UA: NEGATIVE
PH UA: 6 (ref 5.0–8.0)
Protein, UA: NEGATIVE
Spec Grav, UA: 1.01 (ref 1.010–1.025)
UROBILINOGEN UA: 0.2 U/dL

## 2016-12-15 LAB — POCT CBG (FASTING - GLUCOSE)-MANUAL ENTRY: Glucose Fasting, POC: 156 mg/dL — AB (ref 70–99)

## 2016-12-15 MED ORDER — ALLOPURINOL 100 MG PO TABS: 100.0000 mg | ORAL_TABLET | Freq: Every day | ORAL | 1 refills | Status: DC

## 2016-12-15 MED ORDER — METOPROLOL TARTRATE 25 MG PO TABS: 25.0000 mg | ORAL_TABLET | Freq: Two times a day (BID) | ORAL | 1 refills | Status: DC

## 2016-12-15 MED ORDER — AMLODIPINE BESYLATE 5 MG PO TABS: 5.0000 mg | ORAL_TABLET | Freq: Every day | ORAL | 1 refills | Status: DC

## 2016-12-15 MED ORDER — RANITIDINE HCL 300 MG PO TABS: 300.0000 mg | ORAL_TABLET | Freq: Every day | ORAL | 1 refills | Status: DC

## 2016-12-15 MED ORDER — HYDROCHLOROTHIAZIDE 12.5 MG PO TABS: 12.5000 mg | ORAL_TABLET | Freq: Every day | ORAL | 1 refills | Status: DC

## 2016-12-15 NOTE — Progress Notes (Signed)
Name: Laura [name]   MRN: [id number]    DOB: [birth year]   Date:12/15/2016       Progress Note  Subjective  Chief Complaint  Chief Complaint  Patient presents with  . Gastroesophageal Reflux  . Hypertension  . Anemia  . Gout    Gastroesophageal Reflux  She reports no belching, no chest pain, no choking, no coughing, no dysphagia, no early satiety, no globus sensation, no hoarse voice, no nausea, no sore throat, no stridor, no tooth decay, no water brash or no wheezing. The current episode started more than 1 year ago. The problem occurs occasionally. The problem has been unchanged. The symptoms are aggravated by certain foods. Pertinent negatives include no melena or weight loss. She has tried a histamine-2 antagonist for the symptoms. The treatment provided moderate relief.  Hypertension  This is a chronic problem. The current episode started more than 1 year ago. The problem is unchanged. The problem is controlled. Associated symptoms include malaise/fatigue. Pertinent negatives include no blurred vision, chest pain, neck pain, palpitations, PND or shortness of breath. Risk factors for coronary artery disease include diabetes mellitus and dyslipidemia. Past treatments include calcium channel blockers, beta blockers and diuretics. The current treatment provides mild improvement. There are no compliance problems.  There is no history of angina, kidney disease, CAD/MI, CVA, heart failure, left ventricular hypertrophy, PVD or retinopathy. There is no history of chronic renal disease, a hypertension causing med or renovascular disease.  Anemia  Presents for follow-up visit. Symptoms include malaise/fatigue. There has been no anorexia, bruising/bleeding easily, confusion, fever, leg swelling, light-headedness, pallor, palpitations, paresthesias or weight loss. Signs of blood loss that are not present include hematemesis, hematochezia, melena and vaginal bleeding. There is no history of chronic  renal disease or heart failure. There are no compliance problems.   Neurologic Problem  The patient's primary symptoms include an altered mental status, clumsiness and memory loss. The patient's pertinent negatives include no focal sensory loss, focal weakness, loss of balance, near-syncope, slurred speech, syncope, visual change or weakness. This is a new problem. Episode onset: uncertain. The neurological problem developed gradually. There was no focality noted. Pertinent negatives include no chest pain, confusion, dizziness, fever, light-headedness, nausea, neck pain, palpitations or shortness of breath. Past treatments include nothing.    No problem-specific Assessment & Plan notes found for this encounter.   Past Medical History:  Diagnosis Date  . Anemia   . Chronic kidney disease   . Diabetes mellitus without complication (HCC)   . GERD (gastroesophageal reflux disease)   . Gout   . Hypertension     Past Surgical History:  Procedure Laterality Date  . ROTATOR CUFF REPAIR Left   . VAGINAL HYSTERECTOMY      Family History  Problem Relation Age of Onset  . Diabetes Mother   . Cancer Sister     Social History   Social History  . Marital status: Widowed    Spouse name: N/A  . Number of children: N/A  . Years of education: N/A   Occupational History  . Not on file.   Social History Main Topics  . Smoking status: Never Smoker  . Smokeless tobacco: Never Used  . Alcohol use No  . Drug use: No  . Sexual activity: Not Currently   Other Topics Concern  . Not on file   Social History Narrative  . No narrative on file    Allergies  Allergen Reactions  . Angiotensin Receptor Blockers  Outpatient Medications Prior to Visit  Medication Sig Dispense Refill  . ferrous sulfate 325 (65 FE) MG tablet Take 1 tablet by mouth daily. otc    . glimepiride (AMARYL) 4 MG tablet Take 1 tablet by mouth daily. Dr Renae Fickle    . meloxicam [facility]) 15 MG tablet Dedra Skeens    .  pioglitazone (ACTOS) 15 MG tablet Take 1 tablet by mouth daily. Dr Renae Fickle    . allopurinol (ZYLOPRIM) 100 MG tablet Take 100 mg by mouth daily.    Marland Kitchen amLODipine (NORVASC) 5 MG tablet Take 1 tablet (5 mg total) by mouth daily. 90 tablet 0  . hydrochlorothiazide (HYDRODIURIL) 12.5 MG tablet Take 1 tablet (12.5 mg total) by mouth daily. 90 tablet 0  . metoprolol tartrate (LOPRESSOR) 25 MG tablet Take 1 tablet (25 mg total) by mouth 2 (two) times daily. 180 tablet 1  . ranitidine (ZANTAC) 300 MG tablet Take 1 tablet (300 mg total) by mouth at bedtime. 90 tablet 1   No facility-administered medications prior to visit.     Review of Systems  Constitutional: Positive for malaise/fatigue. Negative for chills, fever and weight loss.  HENT: Negative for ear discharge, ear pain, hoarse voice and sore throat.   Eyes: Negative for blurred vision.  Respiratory: Negative for cough, sputum production, choking, shortness of breath and wheezing.   Cardiovascular: Negative for chest pain, palpitations, leg swelling, PND and near-syncope.  Gastrointestinal: Negative for anorexia, blood in stool, diarrhea, dysphagia, hematemesis, hematochezia, melena and nausea.  Genitourinary: Negative for dysuria, frequency, hematuria, urgency and vaginal bleeding.  Musculoskeletal: Negative for joint pain and neck pain.  Skin: Negative for pallor and rash.  Neurological: Negative for dizziness, tingling, sensory change, focal weakness, syncope, weakness, light-headedness, paresthesias and loss of balance.  Endo/Heme/Allergies: Negative for environmental allergies and polydipsia. Does not bruise/bleed easily.  Psychiatric/Behavioral: Positive for memory loss. Negative for confusion, depression and suicidal ideas. The patient is not nervous/anxious.      Objective  Vitals:   12/15/16 0908  BP: 120/72  Pulse: 68  Resp: 12  SpO2: 98%  Weight: 144 lb (65.3 kg)  Height:  (1.575 m)    Physical Exam  Constitutional:  She is well-developed, well-nourished, and in no distress. No distress.  HENT:  Head: Normocephalic and atraumatic.  Right Ear: Tympanic membrane and external ear normal.  Left Ear: Tympanic membrane and external ear normal.  Nose: Nose normal.  Mouth/Throat: Uvula is midline and oropharynx is clear and moist. Mucous membranes are pale and dry.  Eyes: Conjunctivae and EOM are normal. Pupils are equal, round, and reactive to light. Right eye exhibits no discharge. Left eye exhibits no discharge.  Neck: Normal range of motion. Neck supple. No JVD present. No thyromegaly present.  Cardiovascular: Normal rate, regular rhythm, normal heart sounds and intact distal pulses.  Exam reveals no gallop and no friction rub.   No murmur heard. Pulmonary/Chest: Effort normal and breath sounds normal. She has no wheezes. She has no rales.  Abdominal: Soft. Bowel sounds are normal. She exhibits no mass. There is no tenderness. There is no guarding.  Musculoskeletal: Normal range of motion. She exhibits no edema.  Lymphadenopathy:    She has no cervical adenopathy.  Neurological: She is alert. She has normal sensation, normal strength, normal reflexes and intact cranial nerves. She is not disoriented. No cranial nerve deficit.  Skin: Skin is warm and dry. No rash noted. She is not diaphoretic. No pallor.  Psychiatric: Mood and affect normal.  Nursing note and vitals reviewed.     Assessment & Plan  Problem List Items Addressed This Visit      Cardiovascular and Mediastinum   Essential (primary) hypertension - Primary   Relevant Medications   hydrochlorothiazide (HYDRODIURIL) 12.5 MG tablet   metoprolol tartrate (LOPRESSOR) 25 MG tablet   amLODipine (NORVASC) 5 MG tablet     Digestive   Gastro-esophageal reflux disease without esophagitis   Relevant Medications   ranitidine (ZANTAC) 300 MG tablet     Endocrine   Diabetes mellitus, type 2 (HCC)   Relevant Orders   Hemoglobin A1c   POCT CBG  (Fasting - Glucose) (Completed)   POCT urinalysis dipstick (Completed)    Other Visit Diagnoses    Hyperuricemia       Relevant Medications   allopurinol (ZYLOPRIM) 100 MG tablet   Altered mental status, unspecified altered mental status type       Relevant Orders   CBC with Differential/Platelet   Renal Function Panel   TSH   POCT urinalysis dipstick (Completed)      Meds ordered this encounter  Medications  . allopurinol (ZYLOPRIM) 100 MG tablet    Sig: Take 1 tablet (100 mg total) by mouth daily.    Dispense:  90 tablet    Refill:  1  . ranitidine (ZANTAC) 300 MG tablet    Sig: Take 1 tablet (300 mg total) by mouth at bedtime.    Dispense:  90 tablet    Refill:  1    sched appt  . hydrochlorothiazide (HYDRODIURIL) 12.5 MG tablet    Sig: Take 1 tablet (12.5 mg total) by mouth daily.    Dispense:  90 tablet    Refill:  1  . metoprolol tartrate (LOPRESSOR) 25 MG tablet    Sig: Take 1 tablet (25 mg total) by mouth 2 (two) times daily.    Dispense:  180 tablet    Refill:  1  . amLODipine (NORVASC) 5 MG tablet    Sig: Take 1 tablet (5 mg total) by mouth daily.    Dispense:  90 tablet    Refill:  1      Dr. Elizabeth Sauer [facility]  12/15/16

## 2016-12-16 LAB — CBC WITH DIFFERENTIAL/PLATELET
BASOS: 0 %
Basophils Absolute: 0 10*3/uL (ref 0.0–0.2)
EOS (ABSOLUTE): 0.3 10*3/uL (ref 0.0–0.4)
EOS: 4 %
HEMATOCRIT: 34.3 % (ref 34.0–46.6)
HEMOGLOBIN: 11.1 g/dL (ref 11.1–15.9)
IMMATURE GRANS (ABS): 0 10*3/uL (ref 0.0–0.1)
IMMATURE GRANULOCYTES: 0 %
Lymphocytes Absolute: 1.8 10*3/uL (ref 0.7–3.1)
Lymphs: 31 %
MCH: 30.1 pg (ref 26.6–33.0)
MCHC: 32.4 g/dL (ref 31.5–35.7)
MCV: 93 fL (ref 79–97)
MONOCYTES: 7 %
Monocytes Absolute: 0.4 10*3/uL (ref 0.1–0.9)
NEUTROS PCT: 58 %
Neutrophils Absolute: 3.3 10*3/uL (ref 1.4–7.0)
Platelets: 169 10*3/uL (ref 150–379)
RBC: 3.69 x10E6/uL — AB (ref 3.77–5.28)
RDW: 15 % (ref 12.3–15.4)
WBC: 5.8 10*3/uL (ref 3.4–10.8)

## 2016-12-16 LAB — RENAL FUNCTION PANEL
ALBUMIN: 4.1 g/dL (ref 3.5–4.7)
BUN / CREAT RATIO: 16 (ref 12–28)
BUN: 33 mg/dL — AB (ref 8–27)
CALCIUM: 10.2 mg/dL (ref 8.7–10.3)
CHLORIDE: 101 mmol/L (ref 96–106)
CO2: 24 mmol/L (ref 18–29)
Creatinine, Ser: 2.01 mg/dL — ABNORMAL HIGH (ref 0.57–1.00)
GFR calc Af Amer: 25 mL/min/{1.73_m2} — ABNORMAL LOW (ref >59)
GFR calc non Af Amer: 22 mL/min/{1.73_m2} — ABNORMAL LOW (ref >59)
Glucose: 187 mg/dL — ABNORMAL HIGH (ref 65–99)
Phosphorus: 3.1 mg/dL (ref 2.5–4.5)
Potassium: 4.1 mmol/L (ref 3.5–5.2)
Sodium: 140 mmol/L (ref 134–144)

## 2016-12-16 LAB — HGB A1C W/O EAG: HEMOGLOBIN A1C: 7.1 % — AB (ref 4.8–5.6)

## 2016-12-16 LAB — TSH: TSH: 8.19 u[IU]/mL — AB (ref 0.450–4.500)

## 2016-12-18 ENCOUNTER — Other Ambulatory Visit: Payer: Self-pay

## 2016-12-18 ENCOUNTER — Telehealth: Payer: Self-pay

## 2016-12-18 MED ORDER — LEVOTHYROXINE SODIUM 50 MCG PO TABS: 50.0000 ug | ORAL_TABLET | Freq: Every day | ORAL | 1 refills | Status: DC

## 2016-12-18 NOTE — Telephone Encounter (Signed)
Pt was seen in our office on 12/15/16- change in mental status. Normally sees Dr Renae FicklePaul for diabetes- went ahead and drew her labs for maintenance meds including A1C and TSH checked. Thyroid came back elevated and A1C was 7.1. Put in a call to Dr Renae FicklePaul for a follow up with pt to be scheduled. [facility] [place] [facility] but needs follow up on this with Renae FicklePaul

## 2017-01-15 ENCOUNTER — Other Ambulatory Visit: Payer: Self-pay | Admitting: Family Medicine

## 2017-02-13 ENCOUNTER — Other Ambulatory Visit: Payer: Self-pay | Admitting: Family Medicine

## 2017-03-17 ENCOUNTER — Ambulatory Visit (INDEPENDENT_AMBULATORY_CARE_PROVIDER_SITE_OTHER): Payer: Medicare Other | Admitting: Family Medicine

## 2017-03-17 ENCOUNTER — Encounter: Payer: Self-pay | Admitting: Family Medicine

## 2017-03-17 VITALS — BP 120/70 | HR 64 | Ht 62.0 in | Wt 145.0 lb

## 2017-03-17 DIAGNOSIS — K219 Gastro-esophageal reflux disease without esophagitis: Secondary | ICD-10-CM

## 2017-03-17 DIAGNOSIS — I10 Essential (primary) hypertension: Secondary | ICD-10-CM

## 2017-03-17 DIAGNOSIS — E039 Hypothyroidism, unspecified: Secondary | ICD-10-CM | POA: Diagnosis not present

## 2017-03-17 DIAGNOSIS — E790 Hyperuricemia without signs of inflammatory arthritis and tophaceous disease: Secondary | ICD-10-CM | POA: Diagnosis not present

## 2017-03-17 MED ORDER — AMLODIPINE BESYLATE 5 MG PO TABS: 5.0000 mg | ORAL_TABLET | Freq: Every day | ORAL | 1 refills | Status: DC

## 2017-03-17 MED ORDER — LEVOTHYROXINE SODIUM 50 MCG PO TABS: ORAL_TABLET | ORAL | 1 refills | Status: DC

## 2017-03-17 MED ORDER — METOPROLOL TARTRATE 25 MG PO TABS: 25.0000 mg | ORAL_TABLET | Freq: Two times a day (BID) | ORAL | 1 refills | Status: DC

## 2017-03-17 MED ORDER — ALLOPURINOL 100 MG PO TABS: 100.0000 mg | ORAL_TABLET | Freq: Every day | ORAL | 1 refills | Status: DC

## 2017-03-17 MED ORDER — RANITIDINE HCL 300 MG PO TABS: 300.0000 mg | ORAL_TABLET | Freq: Every day | ORAL | 1 refills | Status: DC

## 2017-03-17 NOTE — Progress Notes (Signed)
Name: Laura [name]   MRN: [id number]    DOB: 1927/09/06   Date:03/17/2017       Progress Note  Subjective  Chief Complaint  Chief Complaint  Patient presents with  . Gastroesophageal Reflux  . Hypothyroidism  . Hypertension  . Gout    Patient in additional to below medication   Gastroesophageal Reflux  She reports no abdominal pain, no belching, no chest pain, no choking, no coughing, no globus sensation, no heartburn, no hoarse voice, no nausea, no sore throat or no wheezing. This is a recurrent problem. The current episode started more than 1 year ago. The problem occurs occasionally. The problem has been gradually improving. The symptoms are aggravated by certain foods. Pertinent negatives include no fatigue, melena or weight loss. She has tried a histamine-2 antagonist for the symptoms. The treatment provided moderate relief.  Hypertension  This is a chronic problem. The current episode started more than 1 year ago. The problem has been gradually improving since onset. The problem is controlled. Pertinent negatives include no anxiety, blurred vision, chest pain, headaches, malaise/fatigue, neck pain, orthopnea, palpitations, peripheral edema, PND, shortness of breath or sweats. There are no associated agents to hypertension. Risk factors for coronary artery disease include dyslipidemia and diabetes mellitus. Past treatments include ACE inhibitors and diuretics. The current treatment provides moderate improvement. There are no compliance problems.  There is no history of angina, kidney disease, CAD/MI, CVA, heart failure, left ventricular hypertrophy, PVD or retinopathy. Identifiable causes of hypertension include a thyroid problem. There is no history of chronic renal disease, a hypertension causing med or renovascular disease.  Thyroid Problem  Presents for follow-up visit. Patient reports no anxiety, cold intolerance, constipation, depressed mood, diaphoresis, diarrhea, fatigue, hair  loss, heat intolerance, hoarse voice, palpitations, weight gain or weight loss. The symptoms have been stable. There is no history of heart failure.    No problem-specific Assessment & Plan notes found for this encounter.   Past Medical History:  Diagnosis Date  . Anemia   . Chronic kidney disease   . Diabetes mellitus without complication (HCC)   . GERD (gastroesophageal reflux disease)   . Gout   . Hypertension     Past Surgical History:  Procedure Laterality Date  . ROTATOR CUFF REPAIR Left   . VAGINAL HYSTERECTOMY      Family History  Problem Relation Age of Onset  . Diabetes Mother   . Cancer Sister     Social History   Social History  . Marital status: Widowed    Spouse name: N/A  . Number of children: N/A  . Years of education: N/A   Occupational History  . Not on file.   Social History Main Topics  . Smoking status: Never Smoker  . Smokeless tobacco: Never Used  . Alcohol use No  . Drug use: No  . Sexual activity: Not Currently   Other Topics Concern  . Not on file   Social History Narrative  . No narrative on file    Allergies  Allergen Reactions  . Angiotensin Receptor Blockers     Outpatient Medications Prior to Visit  Medication Sig Dispense Refill  . ferrous sulfate 325 (65 FE) MG tablet Take 1 tablet by mouth daily. otc    . glimepiride (AMARYL) 4 MG tablet Take 1 tablet by mouth daily. Dr Renae Fickle    . meloxicam [facility]) 15 MG tablet Dedra Skeens    . pioglitazone (ACTOS) 15 MG tablet Take 1 tablet by mouth  daily. Dr Renae FicklePaul    . allopurinol (ZYLOPRIM) 100 MG tablet Take 1 tablet (100 mg total) by mouth daily. 90 tablet 1  . amLODipine (NORVASC) 5 MG tablet Take 1 tablet (5 mg total) by mouth daily. 90 tablet 1  . levothyroxine (SYNTHROID, LEVOTHROID) 50 MCG tablet TAKE 1 TABLET(50 MCG) BY MOUTH DAILY 90 tablet 0  . metoprolol tartrate (LOPRESSOR) 25 MG tablet Take 1 tablet (25 mg total) by mouth 2 (two) times daily. 180 tablet 1  . ranitidine  (ZANTAC) 300 MG tablet Take 1 tablet (300 mg total) by mouth at bedtime. 90 tablet 1  . hydrochlorothiazide (HYDRODIURIL) 12.5 MG tablet Take 1 tablet (12.5 mg total) by mouth daily. 90 tablet 1  . levothyroxine (SYNTHROID, LEVOTHROID) 50 MCG tablet TAKE 1 TABLET(50 MCG) BY MOUTH DAILY 30 tablet 0   No facility-administered medications prior to visit.     Review of Systems  Constitutional: Negative for chills, diaphoresis, fatigue, fever, malaise/fatigue, weight gain and weight loss.  HENT: Negative for ear discharge, ear pain, hoarse voice and sore throat.   Eyes: Negative for blurred vision.  Respiratory: Negative for cough, sputum production, choking, shortness of breath and wheezing.   Cardiovascular: Negative for chest pain, palpitations, orthopnea, leg swelling and PND.  Gastrointestinal: Negative for abdominal pain, blood in stool, constipation, diarrhea, heartburn, melena and nausea.  Genitourinary: Negative for dysuria, frequency, hematuria and urgency.  Musculoskeletal: Negative for back pain, joint pain, myalgias and neck pain.  Skin: Negative for rash.  Neurological: Negative for dizziness, tingling, sensory change, focal weakness and headaches.  Endo/Heme/Allergies: Negative for environmental allergies, cold intolerance, heat intolerance and polydipsia. Does not bruise/bleed easily.  Psychiatric/Behavioral: Negative for depression and suicidal ideas. The patient is not nervous/anxious and does not have insomnia.      Objective  Vitals:   03/17/17 1000  BP: 120/70  Pulse: 64  Weight: 145 lb (65.8 kg)  Height: 5\' 2"  (1.575 m)    Physical Exam  Constitutional: She is well-developed, well-nourished, and in no distress. No distress.  HENT:  Head: Normocephalic and atraumatic.  Right Ear: Tympanic membrane, external ear and ear canal normal.  Left Ear: Tympanic membrane, external ear and ear canal normal.  Nose: Nose normal.  Mouth/Throat: Oropharynx is clear and moist.   Eyes: Pupils are equal, round, and reactive to light. Conjunctivae and EOM are normal. Right eye exhibits no discharge. Left eye exhibits no discharge.  Neck: Normal range of motion. Neck supple. No JVD present. No thyromegaly present.  Cardiovascular: Normal rate, regular rhythm, normal heart sounds and intact distal pulses.  Exam reveals no gallop and no friction rub.   No murmur heard. Pulmonary/Chest: Effort normal and breath sounds normal. She has no wheezes. She has no rales.  Abdominal: Soft. Bowel sounds are normal. She exhibits no mass. There is no tenderness. There is no guarding.  Musculoskeletal: Normal range of motion. She exhibits no edema.  Lymphadenopathy:    She has no cervical adenopathy.  Neurological: She is alert. She has normal reflexes.  Skin: Skin is warm and dry. She is not diaphoretic.  Psychiatric: Mood and affect normal.  Nursing note and vitals reviewed.     Assessment & Plan  Problem List Items Addressed This Visit      Cardiovascular and Mediastinum   Essential (primary) hypertension - Primary   Relevant Medications   furosemide (LASIX) 40 MG tablet   amLODipine (NORVASC) 5 MG tablet   metoprolol tartrate (LOPRESSOR) 25 MG tablet  Other Relevant Orders   Renal Function Panel     Digestive   Gastro-esophageal reflux disease without esophagitis   Relevant Medications   ranitidine (ZANTAC) 300 MG tablet     Endocrine   Adult hypothyroidism   Relevant Medications   levothyroxine (SYNTHROID, LEVOTHROID) 50 MCG tablet   metoprolol tartrate (LOPRESSOR) 25 MG tablet   Other Relevant Orders   TSH     Other   Hyperuricemia   Relevant Medications   allopurinol (ZYLOPRIM) 100 MG tablet      Meds ordered this encounter  Medications  . levothyroxine (SYNTHROID, LEVOTHROID) 50 MCG tablet    Sig: TAKE 1 TABLET(50 MCG) BY MOUTH DAILY    Dispense:  90 tablet    Refill:  1    **Patient requests 90 days supply**  . ranitidine (ZANTAC) 300 MG  tablet    Sig: Take 1 tablet (300 mg total) by mouth at bedtime.    Dispense:  90 tablet    Refill:  1    sched appt  . allopurinol (ZYLOPRIM) 100 MG tablet    Sig: Take 1 tablet (100 mg total) by mouth daily.    Dispense:  90 tablet    Refill:  1  . amLODipine (NORVASC) 5 MG tablet    Sig: Take 1 tablet (5 mg total) by mouth daily.    Dispense:  90 tablet    Refill:  1  . metoprolol tartrate (LOPRESSOR) 25 MG tablet    Sig: Take 1 tablet (25 mg total) by mouth 2 (two) times daily.    Dispense:  180 tablet    Refill:  1      Dr. Elizabeth Sauereanna Deadrick Stidd [facility]  03/17/17

## 2017-03-18 LAB — RENAL FUNCTION PANEL
ALBUMIN: 4 g/dL (ref 3.5–4.7)
BUN / CREAT RATIO: 18 (ref 12–28)
BUN: 48 mg/dL — ABNORMAL HIGH (ref 8–27)
CHLORIDE: 99 mmol/L (ref 96–106)
CO2: 24 mmol/L (ref 20–29)
Calcium: 9.3 mg/dL (ref 8.7–10.3)
Creatinine, Ser: 2.6 mg/dL — ABNORMAL HIGH (ref 0.57–1.00)
GFR, EST AFRICAN AMERICAN: 18 mL/min/{1.73_m2} — AB (ref >59)
GFR, EST NON AFRICAN AMERICAN: 16 mL/min/{1.73_m2} — AB (ref >59)
GLUCOSE: 149 mg/dL — AB (ref 65–99)
POTASSIUM: 4.6 mmol/L (ref 3.5–5.2)
Phosphorus: 3.5 mg/dL (ref 2.5–4.5)
SODIUM: 138 mmol/L (ref 134–144)

## 2017-03-18 LAB — TSH: TSH: 1.19 u[IU]/mL (ref 0.450–4.500)

## 2017-04-20 ENCOUNTER — Encounter: Payer: Self-pay | Admitting: Family Medicine

## 2017-04-20 ENCOUNTER — Ambulatory Visit (INDEPENDENT_AMBULATORY_CARE_PROVIDER_SITE_OTHER): Payer: Medicare Other | Admitting: Family Medicine

## 2017-04-20 VITALS — BP 120/80 | HR 68 | Ht 62.0 in | Wt 146.0 lb

## 2017-04-20 DIAGNOSIS — L03313 Cellulitis of chest wall: Secondary | ICD-10-CM | POA: Diagnosis not present

## 2017-04-20 DIAGNOSIS — B379 Candidiasis, unspecified: Secondary | ICD-10-CM

## 2017-04-20 DIAGNOSIS — Z23 Encounter for immunization: Secondary | ICD-10-CM | POA: Diagnosis not present

## 2017-04-20 MED ORDER — NYSTATIN 100000 UNIT/GM EX CREA: 1.0000 "application " | TOPICAL_CREAM | Freq: Two times a day (BID) | CUTANEOUS | 0 refills | Status: DC

## 2017-04-20 MED ORDER — MUPIROCIN CALCIUM 2 % EX CREA: 1.0000 "application " | TOPICAL_CREAM | Freq: Two times a day (BID) | CUTANEOUS | 0 refills | Status: DC

## 2017-04-20 NOTE — Progress Notes (Signed)
Name: Laura [name]   MRN: [id number]    DOB: 13-Dec-1927   Date:04/20/2017       Progress Note  Subjective  Chief Complaint  Chief Complaint  Patient presents with  . Rash    under both breast    Rash  This is a new problem. The current episode started in the past 7 days. The problem has been gradually worsening since onset. The affected locations include the chest. The rash is characterized by itchiness and redness ("stings"). She was exposed to nothing. Pertinent negatives include no cough, diarrhea, fever, joint pain, shortness of breath or sore throat. Past treatments include antihistamine. The treatment provided mild relief.    No problem-specific Assessment & Plan notes found for this encounter.   Past Medical History:  Diagnosis Date  . Anemia   . Chronic kidney disease   . Diabetes mellitus without complication (HCC)   . GERD (gastroesophageal reflux disease)   . Gout   . Hypertension     Past Surgical History:  Procedure Laterality Date  . ROTATOR CUFF REPAIR Left   . VAGINAL HYSTERECTOMY      Family History  Problem Relation Age of Onset  . Diabetes Mother   . Cancer Sister     Social History   Social History  . Marital status: Widowed    Spouse name: N/A  . Number of children: N/A  . Years of education: N/A   Occupational History  . Not on file.   Social History Main Topics  . Smoking status: Never Smoker  . Smokeless tobacco: Never Used  . Alcohol use No  . Drug use: No  . Sexual activity: Not Currently   Other Topics Concern  . Not on file   Social History Narrative  . No narrative on file    Allergies  Allergen Reactions  . Angiotensin Receptor Blockers     Outpatient Medications Prior to Visit  Medication Sig Dispense Refill  . allopurinol (ZYLOPRIM) 100 MG tablet Take 1 tablet (100 mg total) by mouth daily. 90 tablet 1  . amLODipine (NORVASC) 5 MG tablet Take 1 tablet (5 mg total) by mouth daily. 90 tablet 1  . ferrous  sulfate 325 (65 FE) MG tablet Take 1 tablet by mouth daily. otc    . furosemide (LASIX) 40 MG tablet Take 1 tablet by mouth daily. Dr Thedore Mins  6  . glimepiride (AMARYL) 4 MG tablet Take 1 tablet by mouth daily. Dr Renae Fickle    . levothyroxine (SYNTHROID, LEVOTHROID) 50 MCG tablet TAKE 1 TABLET(50 MCG) BY MOUTH DAILY 90 tablet 1  . meloxicam (MOBIC) 15 MG tablet Dedra Skeens    . metoprolol tartrate (LOPRESSOR) 25 MG tablet Take 1 tablet (25 mg total) by mouth 2 (two) times daily. 180 tablet 1  . pioglitazone (ACTOS) 15 MG tablet Take 1 tablet by mouth daily. Dr Renae Fickle    . ranitidine (ZANTAC) 300 MG tablet Take 1 tablet (300 mg total) by mouth at bedtime. 90 tablet 1   No facility-administered medications prior to visit.     Review of Systems  Constitutional: Negative for chills, fever, malaise/fatigue and weight loss.  HENT: Negative for ear discharge, ear pain and sore throat.   Eyes: Negative for blurred vision.  Respiratory: Negative for cough, sputum production, shortness of breath and wheezing.   Cardiovascular: Negative for chest pain, palpitations and leg swelling.  Gastrointestinal: Negative for abdominal pain, blood in stool, constipation, diarrhea, heartburn, melena and nausea.  Genitourinary: Negative  for dysuria, frequency, hematuria and urgency.  Musculoskeletal: Negative for back pain, joint pain, myalgias and neck pain.  Skin: Positive for rash.  Neurological: Negative for dizziness, tingling, sensory change, focal weakness and headaches.  Endo/Heme/Allergies: Negative for environmental allergies and polydipsia. Does not bruise/bleed easily.  Psychiatric/Behavioral: Negative for depression and suicidal ideas. The patient is not nervous/anxious and does not have insomnia.      Objective  Vitals:   04/20/17 0929  BP: 120/80  Pulse: 68  Weight: 146 lb (66.2 kg)  Height: 5\' 2"  (1.575 m)    Physical Exam  Constitutional: She is well-developed, well-nourished, and in no  distress. No distress.  HENT:  Head: Normocephalic and atraumatic.  Right Ear: External ear normal.  Left Ear: External ear normal.  Nose: Nose normal.  Mouth/Throat: Oropharynx is clear and moist.  Eyes: Pupils are equal, round, and reactive to light. Conjunctivae and EOM are normal. Right eye exhibits no discharge. Left eye exhibits no discharge.  Neck: Normal range of motion. Neck supple. No JVD present. No thyromegaly present.  Cardiovascular: Normal rate, regular rhythm, normal heart sounds and intact distal pulses.  Exam reveals no gallop and no friction rub.   No murmur heard. Pulmonary/Chest: Effort normal and breath sounds normal. No respiratory distress. She has no wheezes. She has no rales. She exhibits no tenderness.  Abdominal: Soft. Bowel sounds are normal. She exhibits no mass. There is no tenderness. There is no guarding.  Musculoskeletal: Normal range of motion. She exhibits no edema.  Lymphadenopathy:    She has no cervical adenopathy.  Neurological: She is alert. She has normal reflexes.  Skin: Skin is warm and dry. Rash noted. She is not diaphoretic. There is erythema.  Bilateral beneath breasts  Psychiatric: Mood and affect normal.  Nursing note and vitals reviewed.     Assessment & Plan  Problem List Items Addressed This Visit    None    Visit Diagnoses    Candidiasis    -  Primary   Relevant Medications   mupirocin cream (BACTROBAN) 2 %   nystatin cream (MYCOSTATIN)   Cellulitis of chest wall       Relevant Medications   mupirocin cream (BACTROBAN) 2 %   Influenza vaccine needed       Relevant Orders   Flu vaccine HIGH DOSE PF (Completed)      Meds ordered this encounter  Medications  . mupirocin cream (BACTROBAN) 2 %    Sig: Apply 1 application topically 2 (two) times daily.    Dispense:  15 g    Refill:  0  . nystatin cream (MYCOSTATIN)    Sig: Apply 1 application topically 2 (two) times daily.    Dispense:  30 g    Refill:  0       Dr. Hayden Rasmusseneanna Jones [facility]  04/20/17

## 2017-08-06 ENCOUNTER — Other Ambulatory Visit: Payer: Self-pay | Admitting: Family Medicine

## 2017-08-06 DIAGNOSIS — I10 Essential (primary) hypertension: Secondary | ICD-10-CM

## 2017-09-10 ENCOUNTER — Other Ambulatory Visit: Payer: Self-pay | Admitting: Family Medicine

## 2017-09-10 DIAGNOSIS — E039 Hypothyroidism, unspecified: Secondary | ICD-10-CM

## 2017-10-06 ENCOUNTER — Other Ambulatory Visit: Payer: Self-pay | Admitting: Family Medicine

## 2017-10-06 DIAGNOSIS — E790 Hyperuricemia without signs of inflammatory arthritis and tophaceous disease: Secondary | ICD-10-CM

## 2017-10-19 ENCOUNTER — Ambulatory Visit (INDEPENDENT_AMBULATORY_CARE_PROVIDER_SITE_OTHER): Payer: Medicare Other | Admitting: Family Medicine

## 2017-10-19 ENCOUNTER — Encounter: Payer: Self-pay | Admitting: Family Medicine

## 2017-10-19 VITALS — BP 122/70 | HR 72 | Ht 62.0 in | Wt 142.0 lb

## 2017-10-19 DIAGNOSIS — L649 Androgenic alopecia, unspecified: Secondary | ICD-10-CM

## 2017-10-19 NOTE — Progress Notes (Signed)
Name: Laura [name]   MRN: [id number]    DOB: 1928/07/23   Date:10/19/2017       Progress Note  Subjective  Chief Complaint  Chief Complaint  Patient presents with  . Alopecia    hasn't had hair cut in almost a year, hair hasn't grown at all    Patient is ADAMENT that her hair loss occurred aftering starting 50mcgm of levothyroxinine   Thyroid Problem  Presents for follow-up visit. Symptoms include hair loss. Patient reports no anxiety, cold intolerance, constipation, depressed mood, diaphoresis, diarrhea, dry skin, fatigue, heat intolerance, hoarse voice, leg swelling, menstrual problem, nail problem, palpitations, tremors, visual change, weight gain or weight loss.    No problem-specific Assessment & Plan notes found for this encounter.   Past Medical History:  Diagnosis Date  . Anemia   . Chronic kidney disease   . Diabetes mellitus without complication (HCC)   . GERD (gastroesophageal reflux disease)   . Gout   . Hypertension     Past Surgical History:  Procedure Laterality Date  . ROTATOR CUFF REPAIR Left   . VAGINAL HYSTERECTOMY      Family History  Problem Relation Age of Onset  . Diabetes Mother   . Cancer Sister     Social History   Socioeconomic History  . Marital status: Widowed    Spouse name: Not on file  . Number of children: Not on file  . Years of education: Not on file  . Highest education level: Not on file  Social Needs  . Financial resource strain: Not on file  . Food insecurity - worry: Not on file  . Food insecurity - inability: Not on file  . Transportation needs - medical: Not on file  . Transportation needs - non-medical: Not on file  Occupational History  . Not on file  Tobacco Use  . Smoking status: Never Smoker  . Smokeless tobacco: Never Used  Substance and Sexual Activity  . Alcohol use: No    Alcohol/week: 0.0 oz  . Drug use: No  . Sexual activity: Not Currently  Other Topics Concern  . Not on file  Social History  Narrative  . Not on file    Allergies  Allergen Reactions  . Angiotensin Receptor Blockers     Outpatient Medications Prior to Visit  Medication Sig Dispense Refill  . amLODipine (NORVASC) 5 MG tablet Take 1 tablet (5 mg total) by mouth daily. 90 tablet 1  . ferrous sulfate 325 (65 FE) MG tablet Take 1 tablet by mouth daily. otc    . furosemide (LASIX) 40 MG tablet Take 1 tablet by mouth daily. Dr Thedore MinsSingh  6  . glimepiride (AMARYL) 4 MG tablet Take 1 tablet by mouth daily. Dr Renae FicklePaul    . meloxicam [facility](MOBIC) 15 MG tablet Dedra Skeensodd Mundy    . pioglitazone (ACTOS) 15 MG tablet Take 1 tablet by mouth daily. Dr Renae FicklePaul    . ranitidine (ZANTAC) 300 MG tablet Take 1 tablet (300 mg total) by mouth at bedtime. 90 tablet 1  . allopurinol (ZYLOPRIM) 100 MG tablet TAKE 1 TABLET BY MOUTH EVERY DAY 90 tablet 0  . levothyroxine (SYNTHROID, LEVOTHROID) 50 MCG tablet TAKE 1 TABLET(50 MCG) BY MOUTH DAILY 90 tablet 0  . metoprolol tartrate (LOPRESSOR) 25 MG tablet Take 1 tablet (25 mg total) by mouth 2 (two) times daily. 180 tablet 1  . mupirocin cream (BACTROBAN) 2 % Apply 1 application topically 2 (two) times daily. (Patient not taking: Reported on  10/19/2017) 15 g 0  . nystatin cream (MYCOSTATIN) Apply 1 application topically 2 (two) times daily. (Patient not taking: Reported on 10/19/2017) 30 g 0  . amLODipine (NORVASC) 5 MG tablet TAKE 1 TABLET(5 MG) BY MOUTH DAILY 90 tablet 0   No facility-administered medications prior to visit.     Review of Systems  Constitutional: Negative for chills, diaphoresis, fatigue, fever, malaise/fatigue, weight gain and weight loss.  HENT: Negative for ear discharge, ear pain, hoarse voice and sore throat.   Eyes: Negative for blurred vision.  Respiratory: Negative for cough, sputum production, shortness of breath and wheezing.   Cardiovascular: Negative for chest pain, palpitations and leg swelling.  Gastrointestinal: Negative for abdominal pain, blood in stool, constipation,  diarrhea, heartburn, melena and nausea.  Genitourinary: Negative for dysuria, frequency, hematuria, menstrual problem and urgency.  Musculoskeletal: Negative for back pain, joint pain, myalgias and neck pain.  Skin: Negative for rash.  Neurological: Negative for dizziness, tingling, tremors, sensory change, focal weakness and headaches.  Endo/Heme/Allergies: Negative for environmental allergies, cold intolerance, heat intolerance and polydipsia. Does not bruise/bleed easily.  Psychiatric/Behavioral: Negative for depression and suicidal ideas. The patient is not nervous/anxious and does not have insomnia.      Objective  Vitals:   10/19/17 1338  BP: 122/70  Pulse: 72  Weight: 142 lb (64.4 kg)  Height: 5\' 2"  (1.575 m)    Physical Exam  Constitutional: She is well-developed, well-nourished, and in no distress. No distress.  HENT:  Head: Normocephalic and atraumatic.  Right Ear: External ear normal.  Left Ear: External ear normal.  Nose: Nose normal.  Mouth/Throat: Oropharynx is clear and moist.  Eyes: Conjunctivae and EOM are normal. Pupils are equal, round, and reactive to light. Right eye exhibits no discharge. Left eye exhibits no discharge.  Neck: Normal range of motion. Neck supple. No JVD present. No thyromegaly present.  Cardiovascular: Normal rate, regular rhythm, normal heart sounds and intact distal pulses. Exam reveals no gallop and no friction rub.  No murmur heard. Pulmonary/Chest: Effort normal and breath sounds normal. She has no wheezes. She has no rales.  Abdominal: Soft. Bowel sounds are normal. She exhibits no mass. There is no tenderness. There is no guarding.  Musculoskeletal: Normal range of motion. She exhibits no edema.  Lymphadenopathy:    She has no cervical adenopathy.  Neurological: She is alert. She has normal reflexes.  Skin: Skin is warm and dry. She is not diaphoretic.  Androgenic hair loss pattern  Psychiatric: Mood and affect normal.  Nursing  note and vitals reviewed.     Assessment & Plan  Problem List Items Addressed This Visit    None    Visit Diagnoses    Androgenetic alopecia    -  Primary   patient adament that it is due to taking of thyroid supplement   Relevant Orders   Thyroid Panel With TSH   Ambulatory referral to Dermatology      No orders of the defined types were placed in this encounter.     Dr. Hayden [facility]  10/19/17

## 2017-10-20 LAB — THYROID PANEL WITH TSH
Free Thyroxine Index: 2.1 (ref 1.2–4.9)
T3 Uptake Ratio: 29 % (ref 24–39)
T4, Total: 7.3 ug/dL (ref 4.5–12.0)
TSH: 1.84 u[IU]/mL (ref 0.450–4.500)

## 2017-10-22 ENCOUNTER — Other Ambulatory Visit: Payer: Self-pay

## 2017-10-22 DIAGNOSIS — E039 Hypothyroidism, unspecified: Secondary | ICD-10-CM

## 2017-10-22 MED ORDER — LEVOTHYROXINE SODIUM 25 MCG PO TABS: 25.0000 ug | ORAL_TABLET | Freq: Every day | ORAL | 1 refills | Status: DC

## 2017-11-01 ENCOUNTER — Other Ambulatory Visit: Payer: Self-pay

## 2017-11-06 ENCOUNTER — Other Ambulatory Visit: Payer: Self-pay | Admitting: Family Medicine

## 2017-11-06 DIAGNOSIS — I10 Essential (primary) hypertension: Secondary | ICD-10-CM

## 2017-11-30 ENCOUNTER — Encounter: Payer: Self-pay | Admitting: Family Medicine

## 2017-11-30 ENCOUNTER — Ambulatory Visit: Payer: Medicare Other | Admitting: Family Medicine

## 2017-11-30 VITALS — BP 138/80 | HR 80 | Ht 62.0 in | Wt 143.0 lb

## 2017-11-30 DIAGNOSIS — E039 Hypothyroidism, unspecified: Secondary | ICD-10-CM | POA: Diagnosis not present

## 2017-11-30 NOTE — Progress Notes (Signed)
Name: Laura [name]   MRN: [id number]    DOB: 1928-07-11   Date:11/30/2017       Progress Note  Subjective  Chief Complaint  Chief Complaint  Patient presents with  . Hypothyroidism    needs thyroid rechecked- doesn't want to take the med    Thyroid Problem  Presents for follow-up visit. Symptoms include constipation. Patient reports no anxiety, cold intolerance, depressed mood, diaphoresis, diarrhea, dry skin, fatigue, hair loss, heat intolerance, hoarse voice, leg swelling, menstrual problem, nail problem, palpitations, tremors, visual change, weight gain or weight loss. The symptoms have been stable.    No problem-specific Assessment & Plan notes found for this encounter.   Past Medical History:  Diagnosis Date  . Anemia   . Chronic kidney disease   . Diabetes mellitus without complication (HCC)   . GERD (gastroesophageal reflux disease)   . Gout   . Hypertension     Past Surgical History:  Procedure Laterality Date  . ROTATOR CUFF REPAIR Left   . VAGINAL HYSTERECTOMY      Family History  Problem Relation Age of Onset  . Diabetes Mother   . Cancer Sister     Social History   Socioeconomic History  . Marital status: Widowed    Spouse name: Not on file  . Number of children: Not on file  . Years of education: Not on file  . Highest education level: Not on file  Occupational History  . Not on file  Social Needs  . Financial resource strain: Not on file  . Food insecurity:    Worry: Not on file    Inability: Not on file  . Transportation needs:    Medical: Not on file    Non-medical: Not on file  Tobacco Use  . Smoking status: Never Smoker  . Smokeless tobacco: Never Used  Substance and Sexual Activity  . Alcohol use: No    Alcohol/week: 0.0 oz  . Drug use: No  . Sexual activity: Not Currently  Lifestyle  . Physical activity:    Days per week: Not on file    Minutes per session: Not on file  . Stress: Not on file  Relationships  . Social  connections:    Talks on phone: Not on file    Gets together: Not on file    Attends religious service: Not on file    Active member of club or organization: Not on file    Attends meetings of clubs or organizations: Not on file    Relationship status: Not on file  . Intimate partner violence:    Fear of current or ex partner: Not on file    Emotionally abused: Not on file    Physically abused: Not on file    Forced sexual activity: Not on file  Other Topics Concern  . Not on file  Social History Narrative  . Not on file    Allergies  Allergen Reactions  . Angiotensin Receptor Blockers     Outpatient Medications Prior to Visit  Medication Sig Dispense Refill  . amLODipine (NORVASC) 5 MG tablet TAKE 1 TABLET(5 MG) BY MOUTH DAILY 90 tablet 0  . ferrous sulfate 325 (65 FE) MG tablet Take 1 tablet by mouth daily. otc    . furosemide (LASIX) 40 MG tablet Take 1 tablet by mouth daily. Dr Thedore MinsSingh  6  . glimepiride (AMARYL) 4 MG tablet Take 1 tablet by mouth daily. Dr Renae FicklePaul    . meloxicam (MOBIC) 15 MG  tablet Dedra Skeens    . pioglitazone (ACTOS) 15 MG tablet Take 1 tablet by mouth daily. Dr Renae Fickle    . ranitidine (ZANTAC) 300 MG tablet Take 1 tablet (300 mg total) by mouth at bedtime. 90 tablet 1  . levothyroxine (SYNTHROID, LEVOTHROID) 25 MCG tablet Take 1 tablet (25 mcg total) by mouth daily before breakfast. 30 tablet 1  . mupirocin cream (BACTROBAN) 2 % Apply 1 application topically 2 (two) times daily. (Patient not taking: Reported on 10/19/2017) 15 g 0  . nystatin cream (MYCOSTATIN) Apply 1 application topically 2 (two) times daily. (Patient not taking: Reported on 10/19/2017) 30 g 0  . amLODipine (NORVASC) 5 MG tablet Take 1 tablet (5 mg total) by mouth daily. 90 tablet 1   No facility-administered medications prior to visit.     Review of Systems  Constitutional: Negative for chills, diaphoresis, fatigue, fever, malaise/fatigue, weight gain and weight loss.  HENT: Negative for ear  discharge, ear pain, hoarse voice and sore throat.   Eyes: Negative for blurred vision.  Respiratory: Negative for cough, sputum production, shortness of breath and wheezing.   Cardiovascular: Negative for chest pain, palpitations and leg swelling.  Gastrointestinal: Positive for constipation. Negative for abdominal pain, blood in stool, diarrhea, heartburn, melena and nausea.  Genitourinary: Negative for dysuria, frequency, hematuria, menstrual problem and urgency.  Musculoskeletal: Negative for back pain, joint pain, myalgias and neck pain.  Skin: Negative for rash.  Neurological: Negative for dizziness, tingling, tremors, sensory change, focal weakness and headaches.  Endo/Heme/Allergies: Negative for environmental allergies, cold intolerance, heat intolerance and polydipsia. Does not bruise/bleed easily.  Psychiatric/Behavioral: Negative for depression and suicidal ideas. The patient is not nervous/anxious and does not have insomnia.      Objective  Vitals:   11/30/17 1334  BP: 138/80  Pulse: 80  Weight: 143 lb (64.9 kg)  Height: 5\' 2"  (1.575 m)    Physical Exam  Constitutional: She is oriented to person, place, and time. She appears well-developed and well-nourished.  HENT:  Head: Normocephalic.  Right Ear: External ear normal.  Left Ear: External ear normal.  Mouth/Throat: Oropharynx is clear and moist.  Eyes: Pupils are equal, round, and reactive to light. Conjunctivae and EOM are normal. Lids are everted and swept, no foreign bodies found. Left eye exhibits no hordeolum. No foreign body present in the left eye. Right conjunctiva is not injected. Left conjunctiva is not injected. No scleral icterus.  Neck: Normal range of motion. Neck supple. No JVD present. No tracheal deviation present. No thyromegaly present.  Cardiovascular: Normal rate, regular rhythm, normal heart sounds and intact distal pulses. Exam reveals no gallop and no friction rub.  No murmur  heard. Pulmonary/Chest: Effort normal and breath sounds normal. No respiratory distress. She has no wheezes. She has no rales.  Abdominal: Soft. Bowel sounds are normal. She exhibits no mass. There is no hepatosplenomegaly. There is no tenderness. There is no rebound and no guarding.  Musculoskeletal: Normal range of motion. She exhibits no edema or tenderness.  Lymphadenopathy:    She has no cervical adenopathy.  Neurological: She is alert and oriented to person, place, and time. She has normal strength. She displays normal reflexes. No cranial nerve deficit.  Skin: Skin is warm. No rash noted.  Psychiatric: She has a normal mood and affect. Her mood appears not anxious. She does not exhibit a depressed mood.  Nursing note and vitals reviewed.     Assessment & Plan  Problem List Items Addressed This  Visit      Endocrine   Adult hypothyroidism - Primary      No orders of the defined types were placed in this encounter.     Dr. [facility]den [facility]  11/30/17

## 2017-12-10 ENCOUNTER — Other Ambulatory Visit: Payer: Self-pay | Admitting: Family Medicine

## 2017-12-10 DIAGNOSIS — E039 Hypothyroidism, unspecified: Secondary | ICD-10-CM

## 2017-12-11 ENCOUNTER — Other Ambulatory Visit: Payer: Self-pay | Admitting: Family Medicine

## 2017-12-11 DIAGNOSIS — E039 Hypothyroidism, unspecified: Secondary | ICD-10-CM

## 2018-01-04 ENCOUNTER — Other Ambulatory Visit: Payer: Self-pay | Admitting: Family Medicine

## 2018-01-04 DIAGNOSIS — E790 Hyperuricemia without signs of inflammatory arthritis and tophaceous disease: Secondary | ICD-10-CM

## 2018-01-11 ENCOUNTER — Encounter: Payer: Self-pay | Admitting: Family Medicine

## 2018-01-11 ENCOUNTER — Ambulatory Visit: Payer: Medicare Other | Admitting: Family Medicine

## 2018-01-11 VITALS — BP 120/70 | HR 72 | Ht 62.0 in | Wt 139.0 lb

## 2018-01-11 DIAGNOSIS — R9431 Abnormal electrocardiogram [ECG] [EKG]: Secondary | ICD-10-CM | POA: Diagnosis not present

## 2018-01-11 DIAGNOSIS — K219 Gastro-esophageal reflux disease without esophagitis: Secondary | ICD-10-CM | POA: Diagnosis not present

## 2018-01-11 DIAGNOSIS — E118 Type 2 diabetes mellitus with unspecified complications: Secondary | ICD-10-CM

## 2018-01-11 DIAGNOSIS — E7849 Other hyperlipidemia: Secondary | ICD-10-CM

## 2018-01-11 DIAGNOSIS — I10 Essential (primary) hypertension: Secondary | ICD-10-CM | POA: Diagnosis not present

## 2018-01-11 DIAGNOSIS — R079 Chest pain, unspecified: Secondary | ICD-10-CM | POA: Diagnosis not present

## 2018-01-11 MED ORDER — PANTOPRAZOLE SODIUM 40 MG PO TBEC: 40.0000 mg | DELAYED_RELEASE_TABLET | Freq: Every day | ORAL | 3 refills | Status: DC

## 2018-01-11 NOTE — Assessment & Plan Note (Signed)
Followed by Laura [name] Laura [name]/ cont amlodipine 5 mg and furosemide 

## 2018-01-11 NOTE — Assessment & Plan Note (Signed)
Controlled with last A1C 7.1/ cont Glimeperide 4 mg/actos 

## 2018-01-11 NOTE — Assessment & Plan Note (Signed)
Uncontrolled on Ranitidine 300 mg q hs/ will switch to pantoprazole 40 q day

## 2018-01-11 NOTE — Progress Notes (Signed)
Name: Laura [name]   MRN: [id number]    DOB: 10-13-1927   Date:01/11/2018       Progress Note  Subjective  Chief Complaint  Chief Complaint  Patient presents with  . Gastroesophageal Reflux    takes Ranitidine  qhs, but is having acid reflux during the day- had to take an otc med during the day to stop heart burn. Doesn't remember the name of the med she took    Gastroesophageal Reflux  She reports no abdominal pain, no belching, no chest pain, no choking, no coughing, no dysphagia, no early satiety, no heartburn, no hoarse voice, no nausea, no sore throat, no stridor, no tooth decay, no water brash or no wheezing. occured while walking/sitting down eating. This is a new problem. The current episode started 1 to 4 weeks ago (2 weeks). The problem occurs occasionally (duration 2-3 minutes). The problem has been gradually improving (attributes to not eating). Exacerbated by: walking. Pertinent negatives include no anemia, fatigue, melena, muscle weakness, orthopnea or weight loss. She has tried an antacid and a histamine-2 antagonist for the symptoms. The treatment provided mild relief. Past procedures do not include an abdominal ultrasound, an EGD, esophageal manometry, esophageal pH monitoring, H. pylori antibody titer or a UGI.  Chest Pain   This is a new problem. The current episode started 1 to 4 weeks ago (2 weeks). The onset quality is sudden. The problem occurs every several days. Progression since onset: comes and goes. The pain is present in the substernal region. The pain is at a severity of 10/10. The pain is moderate. The quality of the pain is described as heavy. The pain does not radiate. Pertinent negatives include no abdominal pain, back pain, cough, dizziness, fever, headaches, leg pain, lower extremity edema, malaise/fatigue, nausea, palpitations, shortness of breath or sputum production.  Pertinent negatives for past medical history include no muscle weakness.     Essential (primary) hypertension Followed by Dewayne Hatch Drane/ cont amlodipine 5 mg and furosemide   Gastro-esophageal reflux disease without esophagitis Uncontrolled on Ranitidine 300 mg q hs/ will switch to pantoprazole 40 q day  Type 2 diabetes mellitus with complication, without long-term current use of insulin (HCC) Controlled with last A1C 7.1/ cont Glimeperide 4 mg/actos   Familial multiple lipoprotein-type hyperlipidemia Controlled on diet only   Past Medical History:  Diagnosis Date  . Anemia   . Chronic kidney disease   . Diabetes mellitus without complication (HCC)   . GERD (gastroesophageal reflux disease)   . Gout   . Hypertension     Past Surgical History:  Procedure Laterality Date  . ROTATOR CUFF REPAIR Left   . VAGINAL HYSTERECTOMY      Family History  Problem Relation Age of Onset  . Diabetes Mother   . Cancer Sister     Social History   Socioeconomic History  . Marital status: Widowed    Spouse name: Not on file  . Number of children: Not on file  . Years of education: Not on file  . Highest education level: Not on file  Occupational History  . Not on file  Social Needs  . Financial resource strain: Not on file  . Food insecurity:    Worry: Not on file    Inability: Not on file  . Transportation needs:    Medical: Not on file    Non-medical: Not on file  Tobacco Use  . Smoking status: Never Smoker  . Smokeless tobacco: Never Used  Substance and  Sexual Activity  . Alcohol use: No    Alcohol/week: 0.0 oz  . Drug use: No  . Sexual activity: Not Currently  Lifestyle  . Physical activity:    Days per week: Not on file    Minutes per session: Not on file  . Stress: Not on file  Relationships  . Social connections:    Talks on phone: Not on file    Gets together: Not on file    Attends religious service: Not on file    Active member of club or organization: Not on file    Attends meetings of clubs or organizations: Not on file     Relationship status: Not on file  . Intimate partner violence:    Fear of current or ex partner: Not on file    Emotionally abused: Not on file    Physically abused: Not on file    Forced sexual activity: Not on file  Other Topics Concern  . Not on file  Social History Narrative  . Not on file    Allergies  Allergen Reactions  . Angiotensin Receptor Blockers     Outpatient Medications Prior to Visit  Medication Sig Dispense Refill  . allopurinol (ZYLOPRIM) 100 MG tablet TAKE 1 TABLET BY MOUTH EVERY DAY 90 tablet 0  . amLODipine (NORVASC) 5 MG tablet TAKE 1 TABLET(5 MG) BY MOUTH DAILY 90 tablet 0  . ferrous sulfate 325 (65 FE) MG tablet Take 1 tablet by mouth daily. otc    . furosemide (LASIX) 40 MG tablet Take 1 tablet by mouth daily. Dr Thedore Mins  6  . glimepiride (AMARYL) 4 MG tablet Take 1 tablet by mouth daily. Dr Renae Fickle    . levothyroxine (SYNTHROID, LEVOTHROID) 25 MCG tablet TAKE 1 TABLET (25 MCG TOTAL) BY MOUTH DAILY BEFORE BREAKFAST. 30 tablet 1  . meloxicam (MOBIC) 15 MG tablet Dedra Skeens    . pioglitazone (ACTOS) 15 MG tablet Take 1 tablet by mouth daily. Dr Renae Fickle    . ranitidine (ZANTAC) 300 MG tablet Take 1 tablet (300 mg total) by mouth at bedtime. 90 tablet 1  . mupirocin cream (BACTROBAN) 2 % Apply 1 application topically 2 (two) times daily. (Patient not taking: Reported on 10/19/2017) 15 g 0  . nystatin cream (MYCOSTATIN) Apply 1 application topically 2 (two) times daily. (Patient not taking: Reported on 10/19/2017) 30 g 0   No facility-administered medications prior to visit.     Review of Systems  Constitutional: Negative for chills, fatigue, fever, malaise/fatigue and weight loss.  HENT: Negative for ear discharge, ear pain, hoarse voice and sore throat.   Eyes: Negative for blurred vision.  Respiratory: Negative for cough, sputum production, choking, shortness of breath and wheezing.   Cardiovascular: Negative for chest pain, palpitations and leg swelling.   Gastrointestinal: Negative for abdominal pain, blood in stool, constipation, diarrhea, dysphagia, heartburn, melena and nausea.  Genitourinary: Negative for dysuria, frequency, hematuria and urgency.  Musculoskeletal: Negative for back pain, joint pain, myalgias, muscle weakness and neck pain.  Skin: Negative for rash.  Neurological: Negative for dizziness, tingling, sensory change, focal weakness and headaches.  Endo/Heme/Allergies: Negative for environmental allergies and polydipsia. Does not bruise/bleed easily.  Psychiatric/Behavioral: Negative for depression and suicidal ideas. The patient is not nervous/anxious and does not have insomnia.      Objective  Vitals:   01/11/18 1350  BP: 120/70  Pulse: 72  Weight: 139 lb (63 kg)  Height:  (1.575 m)    Physical Exam  Constitutional: No distress.  HENT:  Head: Normocephalic and atraumatic.  Right Ear: External ear normal.  Left Ear: External ear normal.  Nose: Nose normal.  Mouth/Throat: Oropharynx is clear and moist.  Eyes: Pupils are equal, round, and reactive to light. Conjunctivae and EOM are normal. Right eye exhibits no discharge. Left eye exhibits no discharge.  Neck: Normal range of motion. Neck supple. No JVD present. No thyromegaly present.  Cardiovascular: Normal rate, regular rhythm, normal heart sounds and intact distal pulses. Exam reveals no gallop and no friction rub.  No murmur heard. Pulmonary/Chest: Effort normal and breath sounds normal. She has no wheezes.  Abdominal: Soft. Bowel sounds are normal. She exhibits no mass. There is no tenderness. There is no guarding.  Musculoskeletal: Normal range of motion. She exhibits no edema.  Lymphadenopathy:    She has no cervical adenopathy.  Neurological: She is alert. She has normal reflexes.  Skin: Skin is warm and dry. She is not diaphoretic.  Nursing note and vitals reviewed.     Assessment & Plan  Problem List Items Addressed This Visit       Cardiovascular and Mediastinum   Essential (primary) hypertension    Followed by Dewayne Hatch Drane/ cont amlodipine 5 mg and furosemide         Digestive   Gastro-esophageal reflux disease without esophagitis    Uncontrolled on Ranitidine 300 mg q hs/ will switch to pantoprazole 40 q day      Relevant Medications   pantoprazole (PROTONIX) 40 MG tablet     Endocrine   Type 2 diabetes mellitus with complication, without long-term current use of insulin (HCC)    Controlled with last A1C 7.1/ cont Glimeperide 4 mg/actos         Other   Familial multiple lipoprotein-type hyperlipidemia    Controlled on diet only       Other Visit Diagnoses    Chest pain, unspecified type    -  Primary   discussed with Dr Henriette Combs about onset of intermitant chest pain/will verify upcoming appt./[facility] EKG   Relevant Orders   EKG 12-Lead (Completed)   Ambulatory referral to Cardiology   Gastroesophageal reflux disease, esophagitis presence not specified       Relevant Medications   pantoprazole (PROTONIX) 40 MG tablet   Abnormal EKG       ?new onset LBBB   Relevant Orders   Ambulatory referral to Cardiology      Meds ordered this encounter  Medications  . pantoprazole (PROTONIX) 40 MG tablet    Sig: Take 1 tablet (40 mg total) by mouth daily.    Dispense:  30 tablet    Refill:  3      Dr. [facility]  01/11/18

## 2018-01-11 NOTE — Assessment & Plan Note (Signed)
Controlled on diet only 

## 2018-01-27 ENCOUNTER — Other Ambulatory Visit: Payer: Self-pay | Admitting: Family Medicine

## 2018-01-27 DIAGNOSIS — K219 Gastro-esophageal reflux disease without esophagitis: Secondary | ICD-10-CM

## 2018-03-01 ENCOUNTER — Encounter: Payer: Self-pay | Admitting: Family Medicine

## 2018-03-01 ENCOUNTER — Ambulatory Visit: Payer: Medicare Other | Admitting: Family Medicine

## 2018-03-01 VITALS — BP 142/88 | HR 64 | Ht 62.0 in | Wt 141.0 lb

## 2018-03-01 DIAGNOSIS — E039 Hypothyroidism, unspecified: Secondary | ICD-10-CM | POA: Diagnosis not present

## 2018-03-01 NOTE — Progress Notes (Signed)
Name: Laura [name]   MRN: [id number]    DOB: 1928/01/10   Date:03/01/2018       Progress Note  Subjective  Chief Complaint  Chief Complaint  Patient presents with  . Follow-up    Thyroid Problem  Presents for follow-up visit. Patient reports no anxiety, cold intolerance, constipation, depressed mood, diaphoresis, diarrhea, dry skin, fatigue, hair loss, heat intolerance, hoarse voice, leg swelling, menstrual problem, nail problem, palpitations, tremors, visual change, weight gain or weight loss.    No problem-specific Assessment & Plan notes found for this encounter.   Past Medical History:  Diagnosis Date  . Anemia   . Chronic kidney disease   . Diabetes mellitus without complication (HCC)   . GERD (gastroesophageal reflux disease)   . Gout   . Hypertension     Past Surgical History:  Procedure Laterality Date  . ROTATOR CUFF REPAIR Left   . VAGINAL HYSTERECTOMY      Family History  Problem Relation Age of Onset  . Diabetes Mother   . Cancer Sister     Social History   Socioeconomic History  . Marital status: Widowed    Spouse name: Not on file  . Number of children: Not on file  . Years of education: Not on file  . Highest education level: Not on file  Occupational History  . Not on file  Social Needs  . Financial resource strain: Not on file  . Food insecurity:    Worry: Not on file    Inability: Not on file  . Transportation needs:    Medical: Not on file    Non-medical: Not on file  Tobacco Use  . Smoking status: Never Smoker  . Smokeless tobacco: Never Used  Substance and Sexual Activity  . Alcohol use: No    Alcohol/week: 0.0 oz  . Drug use: No  . Sexual activity: Not Currently  Lifestyle  . Physical activity:    Days per week: Not on file    Minutes per session: Not on file  . Stress: Not on file  Relationships  . Social connections:    Talks on phone: Not on file    Gets together: Not on file    Attends religious service: Not on  file    Active member of club or organization: Not on file    Attends meetings of clubs or organizations: Not on file    Relationship status: Not on file  . Intimate partner violence:    Fear of current or ex partner: Not on file    Emotionally abused: Not on file    Physically abused: Not on file    Forced sexual activity: Not on file  Other Topics Concern  . Not on file  Social History Narrative  . Not on file    Allergies  Allergen Reactions  . Angiotensin Receptor Blockers     Outpatient Medications Prior to Visit  Medication Sig Dispense Refill  . allopurinol (ZYLOPRIM) 100 MG tablet TAKE 1 TABLET BY MOUTH EVERY DAY 90 tablet 0  . amLODipine (NORVASC) 5 MG tablet TAKE 1 TABLET(5 MG) BY MOUTH DAILY 90 tablet 0  . ferrous sulfate 325 (65 FE) MG tablet Take 1 tablet by mouth daily. otc    . furosemide (LASIX) 40 MG tablet Take 1 tablet by mouth daily. Dr Thedore Mins  6  . glimepiride (AMARYL) 4 MG tablet Take 1 tablet by mouth daily. Dr Renae Fickle    . meloxicam (MOBIC) 15 MG tablet Tawanna Cooler  [place]    . pantoprazole (PROTONIX) 40 MG tablet Take 1 tablet (40 mg total) by mouth daily. 30 tablet 3  . pioglitazone (ACTOS) 15 MG tablet Take 1 tablet by mouth daily. Dr Renae Fickle    . quinapril (ACCUPRIL) 5 MG tablet TK 1 T PO QD  12  . mupirocin cream (BACTROBAN) 2 % Apply 1 application topically 2 (two) times daily. (Patient not taking: Reported on 10/19/2017) 15 g 0  . nystatin cream (MYCOSTATIN) Apply 1 application topically 2 (two) times daily. (Patient not taking: Reported on 10/19/2017) 30 g 0  . levothyroxine (SYNTHROID, LEVOTHROID) 25 MCG tablet TAKE 1 TABLET (25 MCG TOTAL) BY MOUTH DAILY BEFORE BREAKFAST. 30 tablet 1  . ranitidine (ZANTAC) 300 MG tablet TAKE 1 TABLET (300 MG TOTAL) BY MOUTH AT BEDTIME. (PATIENT MUST SCHEDULE APPOINTMENT) 90 tablet 0   No facility-administered medications prior to visit.     Review of Systems  Constitutional: Negative for chills, diaphoresis, fatigue, fever,  malaise/fatigue, weight gain and weight loss.  HENT: Negative for ear discharge, ear pain, hoarse voice and sore throat.   Eyes: Negative for blurred vision.  Respiratory: Negative for cough, sputum production, shortness of breath and wheezing.   Cardiovascular: Negative for chest pain, palpitations and leg swelling.  Gastrointestinal: Negative for abdominal pain, blood in stool, constipation, diarrhea, heartburn, melena and nausea.  Genitourinary: Negative for dysuria, frequency, hematuria, menstrual problem and urgency.  Musculoskeletal: Negative for back pain, joint pain, myalgias and neck pain.  Skin: Negative for rash.  Neurological: Negative for dizziness, tingling, tremors, sensory change, focal weakness and headaches.  Endo/Heme/Allergies: Negative for environmental allergies, cold intolerance, heat intolerance and polydipsia. Does not bruise/bleed easily.  Psychiatric/Behavioral: Negative for depression and suicidal ideas. The patient is not nervous/anxious and does not have insomnia.      Objective  Vitals:   03/01/18 1335  BP: (!) 142/88  Pulse: 64  Weight: 141 lb (64 kg)  Height: 5\' 2"  (1.575 m)    Physical Exam  Constitutional: She is oriented to person, place, and time. She appears well-developed and well-nourished.  HENT:  Head: Normocephalic.  Right Ear: External ear normal.  Left Ear: External ear normal.  Mouth/Throat: Oropharynx is clear and moist.  Eyes: Pupils are equal, round, and reactive to light. Conjunctivae and EOM are normal. Lids are everted and swept, no foreign bodies found. Left eye exhibits no hordeolum. No foreign body present in the left eye. Right conjunctiva is not injected. Left conjunctiva is not injected. No scleral icterus.  Neck: Normal range of motion. Neck supple. No JVD present. No tracheal deviation present. No thyromegaly present.  Cardiovascular: Normal rate, regular rhythm, normal heart sounds and intact distal pulses. Exam reveals no  gallop and no friction rub.  No murmur heard. Pulmonary/Chest: Effort normal and breath sounds normal. No respiratory distress. She has no wheezes. She has no rales.  Abdominal: Soft. Bowel sounds are normal. She exhibits no mass. There is no hepatosplenomegaly. There is no tenderness. There is no rebound and no guarding.  Musculoskeletal: Normal range of motion. She exhibits no edema or tenderness.  Lymphadenopathy:    She has no cervical adenopathy.  Neurological: She is alert and oriented to person, place, and time. She has normal strength. She displays normal reflexes. No cranial nerve deficit.  Skin: Skin is warm. No rash noted.  Psychiatric: She has a normal mood and affect. Her mood appears not anxious. She does not exhibit a depressed mood.  Nursing note and  vitals reviewed.     Assessment & Plan  Problem List Items Addressed This Visit    None    Visit Diagnoses    Hypothyroidism, unspecified type    -  Primary   recheck TSH- pt. hesitant to take thyroid medication    Relevant Orders   TSH      No orders of the defined types were placed in this encounter.     Dr. Hayden Rasmusseneanna Jones [facility]  03/01/18

## 2018-03-02 ENCOUNTER — Other Ambulatory Visit: Payer: Self-pay | Admitting: Family Medicine

## 2018-03-02 DIAGNOSIS — I10 Essential (primary) hypertension: Secondary | ICD-10-CM

## 2018-03-02 LAB — TSH: TSH: 2.71 u[IU]/mL (ref 0.450–4.500)

## 2018-03-23 ENCOUNTER — Other Ambulatory Visit: Payer: Self-pay | Admitting: Family Medicine

## 2018-05-07 ENCOUNTER — Other Ambulatory Visit: Payer: Self-pay | Admitting: Family Medicine

## 2018-05-07 DIAGNOSIS — K219 Gastro-esophageal reflux disease without esophagitis: Secondary | ICD-10-CM

## 2018-07-25 ENCOUNTER — Other Ambulatory Visit: Payer: Self-pay | Admitting: Family Medicine

## 2018-07-25 DIAGNOSIS — I10 Essential (primary) hypertension: Secondary | ICD-10-CM

## 2018-08-03 ENCOUNTER — Other Ambulatory Visit: Payer: Self-pay | Admitting: Family Medicine

## 2018-08-03 DIAGNOSIS — K219 Gastro-esophageal reflux disease without esophagitis: Secondary | ICD-10-CM

## 2018-08-25 ENCOUNTER — Ambulatory Visit: Payer: Medicare Other | Admitting: Family Medicine

## 2018-08-25 ENCOUNTER — Inpatient Hospital Stay
Admission: EM | Admit: 2018-08-25 | Discharge: 2018-09-17 | DRG: 208 | Disposition: E | Payer: Medicare Other | Attending: Internal Medicine | Admitting: Internal Medicine

## 2018-08-25 ENCOUNTER — Emergency Department: Payer: Medicare Other

## 2018-08-25 ENCOUNTER — Encounter: Payer: Self-pay | Admitting: Emergency Medicine

## 2018-08-25 ENCOUNTER — Inpatient Hospital Stay: Payer: Medicare Other

## 2018-08-25 ENCOUNTER — Encounter: Payer: Self-pay | Admitting: Family Medicine

## 2018-08-25 VITALS — BP 140/60 | HR 68 | Ht 62.0 in | Wt 134.0 lb

## 2018-08-25 DIAGNOSIS — I214 Non-ST elevation (NSTEMI) myocardial infarction: Secondary | ICD-10-CM | POA: Diagnosis present

## 2018-08-25 DIAGNOSIS — I468 Cardiac arrest due to other underlying condition: Secondary | ICD-10-CM | POA: Diagnosis present

## 2018-08-25 DIAGNOSIS — I447 Left bundle-branch block, unspecified: Secondary | ICD-10-CM | POA: Diagnosis present

## 2018-08-25 DIAGNOSIS — E782 Mixed hyperlipidemia: Secondary | ICD-10-CM

## 2018-08-25 DIAGNOSIS — G931 Anoxic brain damage, not elsewhere classified: Secondary | ICD-10-CM | POA: Diagnosis present

## 2018-08-25 DIAGNOSIS — K219 Gastro-esophageal reflux disease without esophagitis: Secondary | ICD-10-CM | POA: Diagnosis present

## 2018-08-25 DIAGNOSIS — R402312 Coma scale, best motor response, none, at arrival to emergency department: Secondary | ICD-10-CM | POA: Diagnosis present

## 2018-08-25 DIAGNOSIS — J9601 Acute respiratory failure with hypoxia: Secondary | ICD-10-CM | POA: Diagnosis present

## 2018-08-25 DIAGNOSIS — D62 Acute posthemorrhagic anemia: Secondary | ICD-10-CM | POA: Diagnosis present

## 2018-08-25 DIAGNOSIS — I4891 Unspecified atrial fibrillation: Secondary | ICD-10-CM | POA: Diagnosis present

## 2018-08-25 DIAGNOSIS — Z66 Do not resuscitate: Secondary | ICD-10-CM | POA: Diagnosis not present

## 2018-08-25 DIAGNOSIS — J9600 Acute respiratory failure, unspecified whether with hypoxia or hypercapnia: Secondary | ICD-10-CM

## 2018-08-25 DIAGNOSIS — I469 Cardiac arrest, cause unspecified: Secondary | ICD-10-CM | POA: Diagnosis present

## 2018-08-25 DIAGNOSIS — R570 Cardiogenic shock: Secondary | ICD-10-CM | POA: Diagnosis present

## 2018-08-25 DIAGNOSIS — R402112 Coma scale, eyes open, never, at arrival to emergency department: Secondary | ICD-10-CM | POA: Diagnosis present

## 2018-08-25 DIAGNOSIS — J9602 Acute respiratory failure with hypercapnia: Secondary | ICD-10-CM | POA: Diagnosis present

## 2018-08-25 DIAGNOSIS — E790 Hyperuricemia without signs of inflammatory arthritis and tophaceous disease: Secondary | ICD-10-CM | POA: Diagnosis not present

## 2018-08-25 DIAGNOSIS — I472 Ventricular tachycardia: Secondary | ICD-10-CM | POA: Diagnosis present

## 2018-08-25 DIAGNOSIS — I10 Essential (primary) hypertension: Secondary | ICD-10-CM | POA: Diagnosis not present

## 2018-08-25 DIAGNOSIS — D631 Anemia in chronic kidney disease: Secondary | ICD-10-CM | POA: Diagnosis present

## 2018-08-25 DIAGNOSIS — Z23 Encounter for immunization: Secondary | ICD-10-CM

## 2018-08-25 DIAGNOSIS — E875 Hyperkalemia: Secondary | ICD-10-CM

## 2018-08-25 DIAGNOSIS — J069 Acute upper respiratory infection, unspecified: Secondary | ICD-10-CM

## 2018-08-25 DIAGNOSIS — I130 Hypertensive heart and chronic kidney disease with heart failure and stage 1 through stage 4 chronic kidney disease, or unspecified chronic kidney disease: Secondary | ICD-10-CM | POA: Diagnosis present

## 2018-08-25 DIAGNOSIS — R402212 Coma scale, best verbal response, none, at arrival to emergency department: Secondary | ICD-10-CM | POA: Diagnosis present

## 2018-08-25 DIAGNOSIS — N183 Chronic kidney disease, stage 3 (moderate): Secondary | ICD-10-CM | POA: Diagnosis present

## 2018-08-25 DIAGNOSIS — Z833 Family history of diabetes mellitus: Secondary | ICD-10-CM

## 2018-08-25 DIAGNOSIS — Z809 Family history of malignant neoplasm, unspecified: Secondary | ICD-10-CM

## 2018-08-25 DIAGNOSIS — R6521 Severe sepsis with septic shock: Secondary | ICD-10-CM | POA: Diagnosis present

## 2018-08-25 DIAGNOSIS — E1122 Type 2 diabetes mellitus with diabetic chronic kidney disease: Secondary | ICD-10-CM | POA: Diagnosis present

## 2018-08-25 DIAGNOSIS — Z9289 Personal history of other medical treatment: Secondary | ICD-10-CM

## 2018-08-25 DIAGNOSIS — Z79899 Other long term (current) drug therapy: Secondary | ICD-10-CM

## 2018-08-25 DIAGNOSIS — Z90710 Acquired absence of both cervix and uterus: Secondary | ICD-10-CM

## 2018-08-25 DIAGNOSIS — Z888 Allergy status to other drugs, medicaments and biological substances status: Secondary | ICD-10-CM

## 2018-08-25 DIAGNOSIS — Z515 Encounter for palliative care: Secondary | ICD-10-CM | POA: Diagnosis not present

## 2018-08-25 DIAGNOSIS — M109 Gout, unspecified: Secondary | ICD-10-CM | POA: Diagnosis present

## 2018-08-25 DIAGNOSIS — N179 Acute kidney failure, unspecified: Secondary | ICD-10-CM | POA: Diagnosis present

## 2018-08-25 DIAGNOSIS — I5032 Chronic diastolic (congestive) heart failure: Secondary | ICD-10-CM | POA: Diagnosis present

## 2018-08-25 DIAGNOSIS — K7200 Acute and subacute hepatic failure without coma: Secondary | ICD-10-CM | POA: Diagnosis present

## 2018-08-25 DIAGNOSIS — J189 Pneumonia, unspecified organism: Secondary | ICD-10-CM | POA: Diagnosis present

## 2018-08-25 DIAGNOSIS — Z791 Long term (current) use of non-steroidal anti-inflammatories (NSAID): Secondary | ICD-10-CM

## 2018-08-25 DIAGNOSIS — Z7984 Long term (current) use of oral hypoglycemic drugs: Secondary | ICD-10-CM

## 2018-08-25 DIAGNOSIS — A419 Sepsis, unspecified organism: Secondary | ICD-10-CM | POA: Diagnosis present

## 2018-08-25 LAB — CBC
HCT: 26.8 % — ABNORMAL LOW (ref 36.0–46.0)
HCT: 28.6 % — ABNORMAL LOW (ref 36.0–46.0)
Hemoglobin: 8.2 g/dL — ABNORMAL LOW (ref 12.0–15.0)
Hemoglobin: 8.7 g/dL — ABNORMAL LOW (ref 12.0–15.0)
MCH: 28.3 pg (ref 26.0–34.0)
MCH: 28.1 pg (ref 26.0–34.0)
MCHC: 30.6 g/dL (ref 30.0–36.0)
MCHC: 30.4 g/dL (ref 30.0–36.0)
MCV: 92.4 fL (ref 80.0–100.0)
MCV: 92.3 fL (ref 80.0–100.0)
Platelets: 158 10*3/uL (ref 150–400)
Platelets: 171 10*3/uL (ref 150–400)
RBC: 2.9 MIL/uL — ABNORMAL LOW (ref 3.87–5.11)
RBC: 3.1 MIL/uL — ABNORMAL LOW (ref 3.87–5.11)
RDW: 15 % (ref 11.5–15.5)
RDW: 15.1 % (ref 11.5–15.5)
WBC: 12 10*3/uL — ABNORMAL HIGH (ref 4.0–10.5)
WBC: 14 10*3/uL — ABNORMAL HIGH (ref 4.0–10.5)
nRBC: 0 % (ref 0.0–0.2)
nRBC: 0 % (ref 0.0–0.2)

## 2018-08-25 LAB — POCT I-STAT, CHEM 8
BUN: 26 mg/dL — ABNORMAL HIGH (ref 8–23)
BUN: 24 mg/dL — ABNORMAL HIGH (ref 8–23)
Calcium, Ion: 2 mmol/L (ref 1.15–1.40)
Calcium, Ion: 1.48 mmol/L — ABNORMAL HIGH (ref 1.15–1.40)
Chloride: 105 mmol/L (ref 98–111)
Chloride: 104 mmol/L (ref 98–111)
Creatinine, Ser: 1.8 mg/dL — ABNORMAL HIGH (ref 0.44–1.00)
Creatinine, Ser: 1.8 mg/dL — ABNORMAL HIGH (ref 0.44–1.00)
Glucose, Bld: 272 mg/dL — ABNORMAL HIGH (ref 70–99)
Glucose, Bld: 373 mg/dL — ABNORMAL HIGH (ref 70–99)
HCT: 27 % — ABNORMAL LOW (ref 36.0–46.0)
HCT: 21 % — ABNORMAL LOW (ref 36.0–46.0)
HEMOGLOBIN: 9.2 g/dL — AB (ref 12.0–15.0)
HEMOGLOBIN: 7.1 g/dL — AB (ref 12.0–15.0)
Potassium: 6 mmol/L — ABNORMAL HIGH (ref 3.5–5.1)
Potassium: 3.8 mmol/L (ref 3.5–5.1)
Sodium: 137 mmol/L (ref 135–145)
Sodium: 136 mmol/L (ref 135–145)
TCO2: 22 mmol/L (ref 22–32)
TCO2: 18 mmol/L — ABNORMAL LOW (ref 22–32)

## 2018-08-25 LAB — PROTIME-INR
INR: >10
INR: 6.47 — AB
Prothrombin Time: >90 seconds — ABNORMAL HIGH (ref 11.4–15.2)
Prothrombin Time: 55.7 seconds — ABNORMAL HIGH (ref 11.4–15.2)

## 2018-08-25 LAB — BASIC METABOLIC PANEL
Anion gap: 13 (ref 5–15)
BUN: 31 mg/dL — AB (ref 8–23)
CO2: 17 mmol/L — ABNORMAL LOW (ref 22–32)
Calcium: 9.4 mg/dL (ref 8.9–10.3)
Chloride: 105 mmol/L (ref 98–111)
Creatinine, Ser: 1.88 mg/dL — ABNORMAL HIGH (ref 0.44–1.00)
GFR calc Af Amer: 27 mL/min — ABNORMAL LOW (ref >60)
GFR calc non Af Amer: 23 mL/min — ABNORMAL LOW (ref >60)
Glucose, Bld: 268 mg/dL — ABNORMAL HIGH (ref 70–99)
Potassium: 3.8 mmol/L (ref 3.5–5.1)
Sodium: 135 mmol/L (ref 135–145)

## 2018-08-25 LAB — CBC WITH DIFFERENTIAL/PLATELET
Abs Immature Granulocytes: 0.17 10*3/uL — ABNORMAL HIGH (ref 0.00–0.07)
Basophils Absolute: 0 10*3/uL (ref 0.0–0.1)
Basophils Relative: 0 %
Eosinophils Absolute: 0.1 10*3/uL (ref 0.0–0.5)
Eosinophils Relative: 1 %
HCT: 29.4 % — ABNORMAL LOW (ref 36.0–46.0)
Hemoglobin: 8.7 g/dL — ABNORMAL LOW (ref 12.0–15.0)
Immature Granulocytes: 2 %
Lymphocytes Relative: 60 %
Lymphs Abs: 6.7 10*3/uL — ABNORMAL HIGH (ref 0.7–4.0)
MCH: 28 pg (ref 26.0–34.0)
MCHC: 29.6 g/dL — ABNORMAL LOW (ref 30.0–36.0)
MCV: 94.5 fL (ref 80.0–100.0)
Monocytes Absolute: 0.3 10*3/uL (ref 0.1–1.0)
Monocytes Relative: 3 %
Neutro Abs: 3.8 10*3/uL (ref 1.7–7.7)
Neutrophils Relative %: 34 %
PLATELETS: 172 10*3/uL (ref 150–400)
RBC: 3.11 MIL/uL — ABNORMAL LOW (ref 3.87–5.11)
RDW: 15.3 % (ref 11.5–15.5)
Smear Review: UNDETERMINED
WBC: 11.1 10*3/uL — ABNORMAL HIGH (ref 4.0–10.5)
nRBC: 0.3 % — ABNORMAL HIGH (ref 0.0–0.2)

## 2018-08-25 LAB — BLOOD GAS, ARTERIAL
Acid-base deficit: 9.2 mmol/L — ABNORMAL HIGH (ref 0.0–2.0)
BICARBONATE: 15.1 mmol/L — AB (ref 20.0–28.0)
FIO2: 1
MECHVT: 600 mL
O2 Saturation: 99.9 %
PEEP: 7 cmH2O
Patient temperature: 37
RATE: 20 resp/min
pCO2 arterial: 28 mmHg — ABNORMAL LOW (ref 32.0–48.0)
pH, Arterial: 7.34 — ABNORMAL LOW (ref 7.350–7.450)
pO2, Arterial: 371 mmHg — ABNORMAL HIGH (ref 83.0–108.0)

## 2018-08-25 LAB — COMPREHENSIVE METABOLIC PANEL
ALT: 1211 U/L — ABNORMAL HIGH (ref 0–44)
AST: 1308 U/L — ABNORMAL HIGH (ref 15–41)
Albumin: 2.8 g/dL — ABNORMAL LOW (ref 3.5–5.0)
Alkaline Phosphatase: 50 U/L (ref 38–126)
Anion gap: 20 — ABNORMAL HIGH (ref 5–15)
BUN: 29 mg/dL — ABNORMAL HIGH (ref 8–23)
CO2: 19 mmol/L — ABNORMAL LOW (ref 22–32)
Calcium: 14.8 mg/dL (ref 8.9–10.3)
Chloride: 102 mmol/L (ref 98–111)
Creatinine, Ser: 2.03 mg/dL — ABNORMAL HIGH (ref 0.44–1.00)
GFR calc Af Amer: 25 mL/min — ABNORMAL LOW (ref >60)
GFR calc non Af Amer: 21 mL/min — ABNORMAL LOW (ref >60)
Glucose, Bld: 286 mg/dL — ABNORMAL HIGH (ref 70–99)
Potassium: 6.2 mmol/L — ABNORMAL HIGH (ref 3.5–5.1)
Sodium: 141 mmol/L (ref 135–145)
Total Bilirubin: 0.6 mg/dL (ref 0.3–1.2)
Total Protein: 6.2 g/dL — ABNORMAL LOW (ref 6.5–8.1)

## 2018-08-25 LAB — MRSA PCR SCREENING: MRSA by PCR: NEGATIVE

## 2018-08-25 LAB — TROPONIN I
TROPONIN I: 0.3 ng/mL — AB (ref <0.03)
TROPONIN I: 0.5 ng/mL — AB (ref <0.03)
Troponin I: 0.04 ng/mL (ref <0.03)

## 2018-08-25 LAB — CREATININE, SERUM
Creatinine, Ser: 1.89 mg/dL — ABNORMAL HIGH (ref 0.44–1.00)
GFR calc Af Amer: 27 mL/min — ABNORMAL LOW (ref >60)
GFR calc non Af Amer: 23 mL/min — ABNORMAL LOW (ref >60)

## 2018-08-25 LAB — PROCALCITONIN: Procalcitonin: 0.16 ng/mL

## 2018-08-25 LAB — APTT
APTT: 62 s — AB (ref 24–36)
aPTT: >160 seconds (ref 24–36)
aPTT: 149 seconds — ABNORMAL HIGH (ref 24–36)

## 2018-08-25 LAB — GLUCOSE, CAPILLARY
Glucose-Capillary: 277 mg/dL — ABNORMAL HIGH (ref 70–99)
Glucose-Capillary: 233 mg/dL — ABNORMAL HIGH (ref 70–99)

## 2018-08-25 LAB — TRIGLYCERIDES: Triglycerides: 72 mg/dL (ref <150)

## 2018-08-25 LAB — INFLUENZA PANEL BY PCR (TYPE A & B)
Influenza A By PCR: NEGATIVE
Influenza B By PCR: NEGATIVE

## 2018-08-25 MED ORDER — DEXMEDETOMIDINE HCL IN NACL 400 MCG/100ML IV SOLN
0.4000 ug/kg/h | INTRAVENOUS | Status: DC
  Administered 2018-08-25: 0.4 ug/kg/h via INTRAVENOUS

## 2018-08-25 MED ORDER — AMLODIPINE BESYLATE 5 MG PO TABS: ORAL_TABLET | ORAL | 1 refills | Status: AC

## 2018-08-25 MED ORDER — VANCOMYCIN HCL IN DEXTROSE 750-5 MG/150ML-% IV SOLN: 750.0000 mg | INTRAVENOUS | Status: DC

## 2018-08-25 MED ORDER — ALTEPLASE (PULMONARY EMBOLISM) INFUSION
100.0000 mg | Freq: Once | INTRAVENOUS | Status: AC
  Administered 2018-08-25: 100 mg via INTRAVENOUS
  Filled 2018-08-25: qty 100

## 2018-08-25 MED ORDER — METOPROLOL TARTRATE 25 MG PO TABS: 25.0000 mg | ORAL_TABLET | Freq: Two times a day (BID) | ORAL | 1 refills | Status: AC

## 2018-08-25 MED ORDER — PROPOFOL 1000 MG/100ML IV EMUL
5.0000 ug/kg/min | INTRAVENOUS | Status: DC
  Administered 2018-08-25: 10 ug/kg/min via INTRAVENOUS
  Administered 2018-08-26: 30 ug/kg/min via INTRAVENOUS
  Filled 2018-08-25: qty 100

## 2018-08-25 MED ORDER — CHLORHEXIDINE GLUCONATE 0.12% ORAL RINSE (MEDLINE KIT)
15.0000 mL | Freq: Two times a day (BID) | OROMUCOSAL | Status: DC
  Administered 2018-08-25 – 2018-08-28 (×6): 15 mL via OROMUCOSAL

## 2018-08-25 MED ORDER — SODIUM CHLORIDE 0.9 % IV SOLN
500.0000 mg | Freq: Once | INTRAVENOUS | Status: AC
  Administered 2018-08-25: 500 mg via INTRAVENOUS
  Filled 2018-08-25: qty 500

## 2018-08-25 MED ORDER — FENTANYL 2500MCG IN NS 250ML (10MCG/ML) PREMIX INFUSION
25.0000 ug/h | INTRAVENOUS | Status: DC
  Administered 2018-08-25: 50 ug/h via INTRAVENOUS
  Administered 2018-08-26: 250 ug/h via INTRAVENOUS
  Administered 2018-08-26 – 2018-08-27 (×2): 200 ug/h via INTRAVENOUS
  Administered 2018-08-27: 275 ug/h via INTRAVENOUS
  Administered 2018-08-28: 50 ug/h via INTRAVENOUS
  Filled 2018-08-25 (×6): qty 250

## 2018-08-25 MED ORDER — FENTANYL CITRATE (PF) 100 MCG/2ML IJ SOLN: 50.0000 ug | Freq: Once | INTRAMUSCULAR | Status: DC

## 2018-08-25 MED ORDER — IOHEXOL 350 MG/ML SOLN
75.0000 mL | Freq: Once | INTRAVENOUS | Status: AC | PRN
  Administered 2018-08-25: 60 mL via INTRAVENOUS

## 2018-08-25 MED ORDER — FENTANYL BOLUS VIA INFUSION
25.0000 ug | INTRAVENOUS | Status: DC | PRN
  Administered 2018-08-26 – 2018-08-28 (×5): 25 ug via INTRAVENOUS
  Filled 2018-08-25: qty 25

## 2018-08-25 MED ORDER — ALTEPLASE 100 MG IV SOLR
INTRAVENOUS | Status: AC
  Administered 2018-08-25: 100 mg via INTRAVENOUS
  Filled 2018-08-25: qty 100

## 2018-08-25 MED ORDER — LABETALOL HCL 5 MG/ML IV SOLN
INTRAVENOUS | Status: AC
  Filled 2018-08-25: qty 4

## 2018-08-25 MED ORDER — ORAL CARE MOUTH RINSE
15.0000 mL | OROMUCOSAL | Status: DC
  Administered 2018-08-25 – 2018-08-28 (×29): 15 mL via OROMUCOSAL

## 2018-08-25 MED ORDER — HEPARIN SODIUM (PORCINE) 5000 UNIT/ML IJ SOLN: 5000.0000 [IU] | Freq: Three times a day (TID) | INTRAMUSCULAR | Status: DC

## 2018-08-25 MED ORDER — INSULIN ASPART 100 UNIT/ML ~~LOC~~ SOLN
SUBCUTANEOUS | Status: AC
  Administered 2018-08-25: 10 [IU] via INTRAVENOUS
  Filled 2018-08-25: qty 1

## 2018-08-25 MED ORDER — FUROSEMIDE 10 MG/ML IJ SOLN
INTRAMUSCULAR | Status: AC
  Filled 2018-08-25: qty 4

## 2018-08-25 MED ORDER — NOREPINEPHRINE BITARTRATE 1 MG/ML IV SOLN
0.0000 ug/min | INTRAVENOUS | Status: DC
  Administered 2018-08-25 – 2018-08-26 (×2): 5 ug/min via INTRAVENOUS
  Filled 2018-08-25: qty 4

## 2018-08-25 MED ORDER — PANTOPRAZOLE SODIUM 40 MG PO TBEC: 40.0000 mg | DELAYED_RELEASE_TABLET | Freq: Every day | ORAL | 1 refills | Status: AC

## 2018-08-25 MED ORDER — KETAMINE HCL 10 MG/ML IJ SOLN
70.0000 mg | Freq: Once | INTRAMUSCULAR | Status: AC
  Administered 2018-08-25: 70 mg via INTRAVENOUS

## 2018-08-25 MED ORDER — QUINAPRIL HCL 10 MG PO TABS: 10.0000 mg | ORAL_TABLET | Freq: Every day | ORAL | 1 refills | Status: AC

## 2018-08-25 MED ORDER — SODIUM CHLORIDE 0.9 % IV BOLUS
1000.0000 mL | Freq: Once | INTRAVENOUS | Status: AC
  Administered 2018-08-25: 1000 mL via INTRAVENOUS

## 2018-08-25 MED ORDER — ROCURONIUM BROMIDE 50 MG/5ML IV SOLN
70.0000 mg | Freq: Once | INTRAVENOUS | Status: AC
  Administered 2018-08-25: 70 mg via INTRAVENOUS

## 2018-08-25 MED ORDER — FUROSEMIDE 10 MG/ML IJ SOLN
20.0000 mg | Freq: Once | INTRAMUSCULAR | Status: AC
  Administered 2018-08-25: 20 mg via INTRAVENOUS

## 2018-08-25 MED ORDER — SODIUM CHLORIDE 0.9 % IV SOLN
INTRAVENOUS | Status: DC
  Administered 2018-08-25 – 2018-08-28 (×7): via INTRAVENOUS

## 2018-08-25 MED ORDER — PROPOFOL 1000 MG/100ML IV EMUL
INTRAVENOUS | Status: AC
  Administered 2018-08-25: 10 ug/kg/min via INTRAVENOUS
  Filled 2018-08-25: qty 100

## 2018-08-25 MED ORDER — ALLOPURINOL 100 MG PO TABS: 100.0000 mg | ORAL_TABLET | Freq: Every day | ORAL | 1 refills | Status: AC

## 2018-08-25 MED ORDER — ATROPINE SULFATE 1 MG/10ML IJ SOSY
1.0000 mg | PREFILLED_SYRINGE | Freq: Once | INTRAMUSCULAR | Status: AC
  Administered 2018-08-25: 1 mg via INTRAVENOUS

## 2018-08-25 MED ORDER — MONTELUKAST SODIUM 10 MG PO TABS: 10.0000 mg | ORAL_TABLET | Freq: Every day | ORAL | 0 refills | Status: AC

## 2018-08-25 MED ORDER — SODIUM CHLORIDE 0.9 % IV SOLN: 250.0000 mL | Freq: Once | INTRAVENOUS | Status: DC

## 2018-08-25 MED ORDER — SODIUM CHLORIDE 0.9 % IV SOLN
1.0000 g | INTRAVENOUS | Status: DC
  Administered 2018-08-25: 1 g via INTRAVENOUS
  Filled 2018-08-25: qty 1

## 2018-08-25 MED ORDER — SODIUM CHLORIDE 0.9 % IV SOLN: 1.0000 g | INTRAVENOUS | Status: DC

## 2018-08-25 MED ORDER — SODIUM CHLORIDE 0.9 % IV SOLN
1.0000 g | Freq: Once | INTRAVENOUS | Status: AC
  Administered 2018-08-25: 1 g via INTRAVENOUS
  Filled 2018-08-25: qty 10

## 2018-08-25 MED ORDER — DOPAMINE-DEXTROSE 3.2-5 MG/ML-% IV SOLN
0.0000 ug/kg/min | INTRAVENOUS | Status: DC
  Administered 2018-08-25: 10 ug/kg/min via INTRAVENOUS

## 2018-08-25 MED ORDER — VANCOMYCIN HCL 10 G IV SOLR
1500.0000 mg | Freq: Once | INTRAVENOUS | Status: DC
  Filled 2018-08-25: qty 1500

## 2018-08-25 MED ORDER — SODIUM CHLORIDE 0.9 % IV SOLN
500.0000 mg | INTRAVENOUS | Status: DC
  Administered 2018-08-26 – 2018-08-27 (×2): 500 mg via INTRAVENOUS
  Filled 2018-08-25 (×4): qty 500

## 2018-08-25 MED ORDER — INSULIN ASPART 100 UNIT/ML ~~LOC~~ SOLN
10.0000 [IU] | Freq: Once | SUBCUTANEOUS | Status: AC
  Administered 2018-08-25: 10 [IU] via INTRAVENOUS

## 2018-08-25 NOTE — ED Notes (Addendum)
Pt being paced externally at a rate of 70

## 2018-08-25 NOTE — Consult Note (Signed)
Reason for Consult: Cardiac arrest respiratory failure Referring Physician: Dr. Juliene Pina hospitalist  Julieanne Cotton Laura [name] is an 85 y.o. female.  HPI: Patient is a 85 year old female history of chronic (stage III diabetes presented cardiac arrest reportedly went to urgent care because of shortness of breath diagnosis appears to tract infection.  While at the lab patient had a syncopal event and was found to have no pulse.  Had CPR time patient arrived to the ER via EMS after 2 doses of epinephrine and CPR.  The patient was found initially in PEA and was intubated underwent CPR additional 2 more times patient was then placed on several pressors given TPA for possible PE but CT result showed no evidence of PE she is intubated sedated for unclear reasons except for arrhythmic arrest EKG was not clearly apparent to me at the time I saw her.  Because of the multiple cardiac arrest I am not sure the patient ever had a baseline EKG at least is not in the system.  Past Medical History:  Diagnosis Date  . Anemia   . Chronic kidney disease   . Diabetes mellitus without complication (HCC)   . GERD (gastroesophageal reflux disease)   . Gout   . Hypertension     Past Surgical History:  Procedure Laterality Date  . ROTATOR CUFF REPAIR Left   . VAGINAL HYSTERECTOMY      Family History  Problem Relation Age of Onset  . Diabetes Mother   . Cancer Sister     Social History:  reports that she has never smoked. She has never used smokeless tobacco. She reports that she does not drink alcohol or use drugs.  Allergies:  Allergies  Allergen Reactions  . Angiotensin Receptor Blockers     Medications: I have reviewed the patient's current medications.  Results for orders placed or performed during the hospital encounter of 08/30/18 (from the past 48 hour(s))  Troponin I - ONCE - STAT     Status: Abnormal   Collection Time: Aug 30, 2018  1:11 PM  Result Value Ref Range   Troponin I 0.04 (HH) <0.03 ng/mL     Comment: CRITICAL RESULT CALLED TO, READ BACK BY AND VERIFIED WITH KAILEY WALKER AT 1415 ON 08/30/2018 QSD Performed at [facility], [address] [place], [place] [address]   CBC with Differential     Status: Abnormal   Collection Time: 2018-08-30  1:11 PM  Result Value Ref Range   WBC 11.1 (H) 4.0 - 10.5 K/uL   RBC 3.11 (L) 3.87 - 5.11 MIL/uL   Hemoglobin 8.7 (L) 12.0 - 15.0 g/dL   HCT 60.4 (L) 54.0 - 98.1 %   MCV 94.5 80.0 - 100.0 fL   MCH 28.0 26.0 - 34.0 pg   MCHC 29.6 (L) 30.0 - 36.0 g/dL   RDW 19.1 47.8 - 29.5 %   Platelets 172 150 - 400 K/uL   nRBC 0.3 (H) 0.0 - 0.2 %   Neutrophils Relative % 34 %   Neutro Abs 3.8 1.7 - 7.7 K/uL   Lymphocytes Relative 60 %   Lymphs Abs 6.7 (H) 0.7 - 4.0 K/uL   Monocytes Relative 3 %   Monocytes Absolute 0.3 0.1 - 1.0 K/uL   Eosinophils Relative 1 %   Eosinophils Absolute 0.1 0.0 - 0.5 K/uL   Basophils Relative 0 %   Basophils Absolute 0.0 0.0 - 0.1 K/uL   WBC Morphology MORPHOLOGY UNREMARKABLE    Smear Review PLATELET CLUMPS NOTED ON SMEAR, UNABLE  TO ESTIMATE    Immature Granulocytes 2 %   Abs Immature Granulocytes 0.17 (H) 0.00 - 0.07 K/uL    Comment: Performed at [facility], [address] [place], [place] [address]  Comprehensive metabolic panel     Status: Abnormal   Collection Time: 2018-09-19  1:11 PM  Result Value Ref Range   Sodium 141 135 - 145 mmol/L   Potassium 6.2 (H) 3.5 - 5.1 mmol/L   Chloride 102 98 - 111 mmol/L   CO2 19 (L) 22 - 32 mmol/L   Glucose, Bld 286 (H) 70 - 99 mg/dL   BUN 29 (H) 8 - 23 mg/dL   Creatinine, Ser 4.66 (H) 0.44 - 1.00 mg/dL   Calcium 59.9 (HH) 8.9 - 10.3 mg/dL    Comment: CRITICAL RESULT CALLED TO, READ BACK BY AND VERIFIED WITH KAILEY WALKER ON 2018/09/19 AT 1415 QSD    Total Protein 6.2 (L) 6.5 - 8.1 g/dL   Albumin 2.8 (L) 3.5 - 5.0 g/dL   AST 3,570 (H) 15 - 41 U/L   ALT 1,211 (H) 0 - 44 U/L   Alkaline Phosphatase 50 38 - 126 U/L   Total Bilirubin 0.6 0.3 - 1.2  mg/dL   GFR calc non Af Amer 21 (L) >60 mL/min   GFR calc Af Amer 25 (L) >60 mL/min   Anion gap 20 (H) 5 - 15    Comment: Performed at [facility], [address] [place], [place] [address]  Protime-INR     Status: None   Collection Time: 2018-09-19  1:11 PM  Result Value Ref Range   Prothrombin Time TEST WILL BE CREDITED 11.4 - 15.2 seconds    Comment: SAMPLE HAS FIBRIN. NOTIFIED KISHAN PATEL ON 09-19-18 AT 1532 QSD CORRECTED ON 01/09 AT 1540: PREVIOUSLY REPORTED AS 14.9    INR TEST WILL BE CREDITED     Comment: Performed at [facility], [address] [place], [place] [address] CORRECTED ON 01/09 AT 1540: PREVIOUSLY REPORTED AS 1.18   I-STAT, chem 8     Status: Abnormal   Collection Time: 09-19-2018  1:14 PM  Result Value Ref Range   Sodium 137 135 - 145 mmol/L   Potassium 6.0 (H) 3.5 - 5.1 mmol/L   Chloride 105 98 - 111 mmol/L   BUN 26 (H) 8 - 23 mg/dL   Creatinine, Ser 9.23 (H) 0.44 - 1.00 mg/dL   Glucose, Bld 300 (H) 70 - 99 mg/dL   Calcium, Ion 7.62 (HH) 1.15 - 1.40 mmol/L   TCO2 22 22 - 32 mmol/L   Hemoglobin 9.2 (L) 12.0 - 15.0 g/dL   HCT 26.3 (L) 33.5 - 45.6 %   Comment NOTIFIED PHYSICIAN   I-STAT, chem 8     Status: Abnormal   Collection Time: 09/19/2018  1:51 PM  Result Value Ref Range   Sodium 136 135 - 145 mmol/L   Potassium 3.8 3.5 - 5.1 mmol/L   Chloride 104 98 - 111 mmol/L   BUN 24 (H) 8 - 23 mg/dL   Creatinine, Ser 2.56 (H) 0.44 - 1.00 mg/dL   Glucose, Bld 389 (H) 70 - 99 mg/dL   Calcium, Ion 3.73 (H) 1.15 - 1.40 mmol/L   TCO2 18 (L) 22 - 32 mmol/L   Hemoglobin 7.1 (L) 12.0 - 15.0 g/dL   HCT 42.8 (L) 76.8 - 11.5 %  APTT     Status: Abnormal   Collection Time: 09/19/2018  2:42 PM  Result Value Ref Range  aPTT >160 (HH) 24 - 36 seconds    Comment:        IF BASELINE aPTT IS ELEVATED, SUGGEST PATIENT RISK ASSESSMENT BE USED TO DETERMINE APPROPRIATE ANTICOAGULANT THERAPY. RESULT REPEATED AND VERIFIED CRITICAL RESULT CALLED TO, READ  BACK BY AND VERIFIED WITH: KISHAN PATEL ON 09/13/2018 AT 1532 QSD Performed at [facility], [address] [place], [place] [address]   Protime-INR     Status: Abnormal   Collection Time: 09/05/2018  2:42 PM  Result Value Ref Range   Prothrombin Time >90.0 (H) 11.4 - 15.2 seconds   INR >10.00 (HH)     Comment: RESULT REPEATED AND VERIFIED CRITICAL RESULT CALLED TO, READ BACK BY AND VERIFIED WITH: KISHAN PATEL ON 08/31/2018 AT 1532 QSD Performed at [facility], [address] [place], [place] [address]   CBC     Status: Abnormal   Collection Time: 09/15/2018  2:50 PM  Result Value Ref Range   WBC 12.0 (H) 4.0 - 10.5 K/uL   RBC 2.90 (L) 3.87 - 5.11 MIL/uL   Hemoglobin 8.2 (L) 12.0 - 15.0 g/dL   HCT 09.8 (L) 11.9 - 14.7 %   MCV 92.4 80.0 - 100.0 fL   MCH 28.3 26.0 - 34.0 pg   MCHC 30.6 30.0 - 36.0 g/dL   RDW 82.9 56.2 - 13.0 %   Platelets 158 150 - 400 K/uL   nRBC 0.0 0.0 - 0.2 %    Comment: Performed at [facility], [address] [place], [place] [address]  Blood gas, arterial     Status: Abnormal   Collection Time: 08/20/2018  3:00 PM  Result Value Ref Range   FIO2 1.00    Mode ASSIST CONTROL    VT 600 mL   LHR 20 resp/min   Peep/cpap 7.0 cm H20   pH, Arterial 7.34 (L) 7.350 - 7.450   pCO2 arterial 28 (L) 32.0 - 48.0 mmHg   pO2, Arterial 371 (H) 83.0 - 108.0 mmHg   Bicarbonate 15.1 (L) 20.0 - 28.0 mmol/L   Acid-base deficit 9.2 (H) 0.0 - 2.0 mmol/L   O2 Saturation 99.9 %   Patient temperature 37.0    Collection site REVIEWED BY    Sample type ARTERIAL DRAW    Allens test (pass/fail) PASS PASS    Comment: Performed at [facility], [address] [place], [place] [address]  Glucose, capillary     Status: Abnormal   Collection Time: 08/26/2018  3:59 PM  Result Value Ref Range   Glucose-Capillary 277 (H) 70 - 99 mg/dL  CBC     Status: Abnormal   Collection Time: 08/28/2018  4:21 PM  Result Value Ref Range   WBC 14.0 (H) 4.0 - 10.5  K/uL   RBC 3.10 (L) 3.87 - 5.11 MIL/uL   Hemoglobin 8.7 (L) 12.0 - 15.0 g/dL   HCT 95.2 (L) 84.1 - 32.4 %   MCV 92.3 80.0 - 100.0 fL   MCH 28.1 26.0 - 34.0 pg   MCHC 30.4 30.0 - 36.0 g/dL   RDW 40.1 02.7 - 25.3 %   Platelets 171 150 - 400 K/uL   nRBC 0.0 0.0 - 0.2 %    Comment: Performed at [facility], [address] [place], [place] [address]  Creatinine, serum     Status: Abnormal   Collection Time: 09/03/2018  4:26 PM  Result Value Ref Range   Creatinine, Ser 1.89 (H) 0.44 - 1.00 mg/dL   GFR  calc non Af Amer 23 (L) >60 mL/min   GFR calc Af Amer 27 (L) >60 mL/min    Comment: Performed at [facility], [address] [place], [place] [address]  Troponin I - Now Then Q6H     Status: Abnormal   Collection Time: 09/03/2018  4:26 PM  Result Value Ref Range   Troponin I 0.30 (HH) <0.03 ng/mL    Comment: CRITICAL RESULT CALLED TO, READ BACK BY AND VERIFIED WITH TANAILLY JIRALT AT 1709 09/10/2018.  TFK Performed at [facility], [address] [place], [place] [address]     Ct Head Wo Contrast  Result Date: 08/24/2018 CLINICAL DATA:  Patient presented to the emergency department for cardiac arrest. Given intravenous contrast earlier today. She also complained of difficulty breathing earlier today and went to the urgent care with shortness of breath and cough. Patient had a syncopal event while at the lap, subsequently found to have no pulse. CPR begun immediately and EMS called. EXAM: CT HEAD WITHOUT CONTRAST TECHNIQUE: Contiguous axial images were obtained from the base of the skull through the vertex without intravenous contrast. COMPARISON:  None. FINDINGS: Brain: No evidence of acute infarction, hemorrhage, hydrocephalus, extra-axial collection or mass lesion/mass effect. There is mild ventricular and sulcal enlargement reflecting age-appropriate volume loss. There are mild areas of white matter hypoattenuation consistent with chronic microvascular ischemic  change. Vascular: No hyperdense vessel or unexpected calcification. Skull: Normal. Negative for fracture or focal lesion. Sinuses/Orbits: Visualized globes and orbits are unremarkable. The visualized sinuses and mastoid air cells are clear. Other: None. IMPRESSION: 1. No acute intracranial abnormalities. 2. Age-appropriate volume loss and mild chronic microvascular ischemic change. Electronically Signed   By: Amie Portland M.D.   On: 08/30/2018 15:54   Ct Angio Chest Pe W And/or Wo Contrast  Result Date: 09/05/2018 CLINICAL DATA:  C/o SOB and cough, followed by cardiac arrest EXAM: CT ANGIOGRAPHY CHEST WITH CONTRAST TECHNIQUE: Multidetector CT imaging of the chest was performed using the standard protocol during bolus administration of intravenous contrast. Multiplanar CT image reconstructions and MIPs were obtained to evaluate the vascular anatomy. CONTRAST:  71mL OMNIPAQUE IOHEXOL 350 MG/ML SOLN COMPARISON:  Current chest radiograph. FINDINGS: Cardiovascular: There is satisfactory opacification of the pulmonary arteries to the segmental level. There is no evidence of a pulmonary embolism. Heart is mildly enlarged. There are three-vessel coronary artery calcifications. No pericardial effusion. Great vessels are normal in caliber. No aortic dissection minimal aortic atherosclerosis. Mediastinum/Nodes: No neck base, axillary, mediastinal or hilar masses or pathologically enlarged lymph nodes. Trachea is patent. Endotracheal tube tip projects 13 mm above the Carina. Nasal/orogastric tube passes below the diaphragm well into the stomach. Lungs/Pleura: Small, right greater than left, pleural effusions. There is dependent airspace opacity in both lungs, right greater than left. At least a component of this is presumably atelectasis. There is additional patchy peribronchovascular type airspace consolidation, both confluent and ground-glass, and both lungs, greater on the right, as well as bilateral interstitial  thickening that is most evident in the upper lobes. No pneumothorax. Upper Abdomen: No acute abnormality. Musculoskeletal: No fracture or acute finding. No osteoblastic or osteolytic lesions. Review of the MIP images confirms the above findings. IMPRESSION: 1. No evidence of a pulmonary embolism. 2. Dependent bilateral lung consolidation/atelectasis. Atelectasis is presumably a component of the dependent opacity, but there are findings also consistent with pneumonia, particularly on the right. In addition, there are patchy areas of confluent and ground-glass opacities as well as interstitial thickening  and small bilateral pleural effusions that are consistent with pulmonary edema. 3. Mild cardiomegaly and three-vessel coronary artery calcifications. Minor aortic atherosclerosis. 4. Support apparatus is well positioned. Aortic Atherosclerosis (ICD10-I70.0). Electronically Signed   By: Amie Portlandavid  Ormond M.D.   On: 09-06-18 14:39   Dg Chest Portable 1 View  Result Date: 08/18/2018 CLINICAL DATA:  Intubation. EXAM: PORTABLE CHEST 1 VIEW COMPARISON:  Chest x-ray 10/22/2012. FINDINGS: Endotracheal tube tip noted at the level of the carina, proximal repositioning of approximately 4 cm suggested. NG tube noted with tip below left hemidiaphragm. Cardiomegaly. Diffuse bilateral pulmonary infiltrates/edema. Cardiac B-lines noted. Findings most consistent with congestive heart failure with bilateral pulmonary edema. IMPRESSION: 1. Endotracheal tube tip noted just above the carina. Proximal repositioning of approximately 4 cm suggested. NG tube noted with tip below left hemidiaphragm. 2. Findings consistent with congestive heart failure with bilateral pulmonary edema. Critical Value/emergent results were called by telephone at the time of interpretation on 09/03/2018 at 1:48 pm to Dr. Wyline MoodVeranese, who verbally acknowledged these results. Electronically Signed   By: Maisie Fushomas  Register   On: 09-06-18 13:52    Review of Systems   Unable to perform ROS: Intubated   Blood pressure 116/62, pulse 70, temperature (!) 92.8 F (33.8 C), resp. rate 20, height 5\' 2"  (1.575 m), weight 60.8 kg, SpO2 97 %. Physical Exam  Nursing note and vitals reviewed. Constitutional: She is oriented to person, place, and time. She appears well-developed and well-nourished.  HENT:  Head: Normocephalic and atraumatic.  Eyes: Pupils are equal, round, and reactive to light. Conjunctivae and EOM are normal.  Neck: Normal range of motion. Neck supple.  Cardiovascular: Normal rate and regular rhythm.  Murmur heard. Respiratory: Effort normal and breath sounds normal.  GI: Soft. Bowel sounds are normal.  Musculoskeletal: Normal range of motion.  Neurological: She is alert and oriented to person, place, and time. She has normal reflexes.  Skin: Skin is warm and dry.    Assessment/Plan: Cardiac arrest Congestive heart failure Respiratory failure Chronic renal insufficiency stage III Diabetes Status post CPR Intubated sedated GERD Hypertension . Plan Status post TPA in emergency room Continue respiratory support Agree with critical care management and vent support Follow-up troponins and EKGs Agree with ventilatory management Correct hyperkalemia Recommend echocardiogram for further assessment evaluation Aggressive medical therapy  If patient rules in for significant non-STEMI would consider invasive cardiac cath at some point prior to discharge Continue IV amiodarone therapy because of post cardiac arrest   D  08/27/2018, 5:21 PM

## 2018-08-25 NOTE — ED Notes (Signed)
DG at bedside. 

## 2018-08-25 NOTE — ED Notes (Addendum)
Pulse check, no pulse, CPR resumed

## 2018-08-25 NOTE — Progress Notes (Signed)
ANTICOAGULATION CONSULT NOTE - Initial Consult  Pharmacy Consult for heparin Indication: pulmonary embolus post alteplase.   Allergies  Allergen Reactions  . Angiotensin Receptor Blockers     Patient Measurements: Height: 5\' 2"  (157.5 cm) Weight: 134 lb (60.8 kg) IBW/kg (Calculated) : 50.1 Heparin Dosing Weight: 60.8 kg  Vital Signs: Temp: 96.1 F (35.6 C) (01/09 1331) Temp Source: Bladder (01/09 1326) BP: 129/52 (01/09 1331) Pulse Rate: 61 (01/09 1331)  Labs: Recent Labs    09-16-2018 1311  HGB 8.7*  HCT 29.4*  PLT 172    CrCl cannot be calculated (Patient's most recent lab result is older than the maximum 21 days allowed.).   Medical History: Past Medical History:  Diagnosis Date  . Anemia   . Chronic kidney disease   . Diabetes mellitus without complication (HCC)   . GERD (gastroesophageal reflux disease)   . Gout   . Hypertension     Medications:  (Not in a hospital admission)  Scheduled:  . labetalol       Infusions:  . alteplase 100 mg (2018/09/16 1343)  . dexmedetomidine (PRECEDEX) IV infusion 0.4 mcg/kg/hr (09-16-2018 1321)  . norepinephrine (LEVOPHED) Adult infusion 10 mcg/min (09/16/18 1346)  . sodium chloride 1,000 mL (2018/09/16 1322)  . sodium chloride 1,000 mL (09/16/18 1346)   PRN:   Assessment: Pharmacy consulted to start heparin for PE. Will start once aPTT level is < 80 seconds. If > 80 seconds will check aPTT every 2 hours until aPTT < 80 seconds.   Goal of Therapy:  Heparin level 0.3-0.5 units/ml for the first 24 hours than 0.3-0.7 seconds Monitor platelets by anticoagulation protocol: Yes   Plan:  Start heparin infusion at 700 units/hr Check anti-Xa level in 8 hours and daily while on heparin Continue to monitor H&H and platelets  Ronnald Ramp, PharmD, BCPS Clinical Pharmacist 09/16/2018,1:45 PM

## 2018-08-25 NOTE — ED Notes (Signed)
Pulses present  

## 2018-08-25 NOTE — ED Notes (Signed)
Calcium chloride administered by RN Aundra Millet

## 2018-08-25 NOTE — ED Notes (Signed)
1 amp of d50 given by RN Aundra Millet

## 2018-08-25 NOTE — ED Notes (Signed)
Epi given by RN Berline Lopes

## 2018-08-25 NOTE — ED Notes (Signed)
Attempted to call report to ICU, floor unable to take report at this time

## 2018-08-25 NOTE — ED Notes (Signed)
Warm blankets applied to pt.  

## 2018-08-25 NOTE — ED Notes (Signed)
ventilator setting changed to a PEEP of 7 and 100% oxygen. Saturation remains 80%

## 2018-08-25 NOTE — ED Notes (Signed)
Calcium chloride given by RN Berline Lopes

## 2018-08-25 NOTE — Psychosocial Assessment (Signed)
Patient transported to CT on transport vent. 

## 2018-08-25 NOTE — Consult Note (Signed)
CRITICAL CARE NOTE  CC  Severe resp failure Cardiac arrest  HPI 85 y.o. AAF with CKD 3 Admitted to ICU for recurrent cardiac arrest Patient lost pule 4 times H/o SOB and URI symptoms several days Given several rounds of EPI and CPR Patient placed on several vasopressors- Intubated on full  Vent support  S/p TPA for presumed PE CT chest NEG for PE  K -6.2 AST/ALT elevated HGB 8 Creat 1.8 INR>10  CXR b/l interstitial infiltrates c/w pulm edema  Multiorgan failure in shock Critically ill   PATIENT IS NOT A CANDIDATE FOR HYPOTHERMIA PROTOCOL DUE TO RECURRENT BOUTS OF CARDIAC ARREST AND SHE WAS GIVEN TPA AND HER INR>10 and VERY HIGH RISK FOR BLEEDING TO DEATH     SIGNIFICANT EVENTS 09/05/22 CPR, INTUBATED   BP (!) 128/55   Pulse 66   Temp (!) 92.8 F (33.8 C)   Resp 20   Ht '5\' 2"'$  (1.575 m)   Wt 60.8 kg   SpO2 100%   BMI 24.51 kg/m    REVIEW OF SYSTEMS  PATIENT IS UNABLE TO PROVIDE COMPLETE REVIEW OF SYSTEMS DUE TO SEVERE CRITICAL ILLNESS   PHYSICAL EXAMINATION:  GENERAL:critically ill appearing, +resp distress HEAD: Normocephalic, atraumatic.  EYES: Pupils equal, round, reactive to light.  No scleral icterus.  MOUTH: Moist mucosal membrane. NECK: Supple. No thyromegaly. No nodules. No JVD.  PULMONARY: +rhonchi, +wheezing CARDIOVASCULAR: S1 and S2. Regular rate and rhythm. No murmurs, rubs, or gallops.  GASTROINTESTINAL: Soft, nontender, -distended. No masses. Positive bowel sounds. No hepatosplenomegaly.  MUSCULOSKELETAL: No swelling, clubbing, or edema.  NEUROLOGIC: obtunded, GCS<8 SKIN:intact,warm,dry      Indwelling Urinary Catheter continued, requirement due to   Reason to continue Indwelling Urinary Catheter for strict Intake/Output monitoring for hemodynamic instability         Ventilator continued, requirement due to, resp failure    Ventilator Sedation RASS 0 to -2     ASSESSMENT AND PLAN SYNOPSIS  85 yo AAF admitted to ICU for  severe resp failure and cardiac arrest most likely from URI viral syndrome leading to acute cardiac arrest with liver failure and cardiogenic/septic shock   Severe Hypoxic and Hypercapnic Respiratory Failure -continue Full MV support -continue Bronchodilator Therapy -Wean Fio2 and PEEP as tolerated -will perform SAT/SBT when respiratory parameters are met   CARDIAC FAILURE-sCHF check ECHO -oxygen as needed -Lasix as tolerated -follow up cardiac enzymes as indicated Cardiology consult pending   Renal Failure- -follow chem 7 -follow UO -continue Foley Catheter-assess need daily   NEUROLOGY - intubated and sedated - minimal sedation to achieve a RASS goal: -1 Wake up assessment pending   Septic shock/Cardiogenic shock -use vasopressors to keep MAP>65-LEVO STOP DOPAMINE -follow ABG and LA -follow up cultures -emperic ABX -consider stress dose steroids  CARDIAC ICU monitoring Check EKG  ID Stop ABX check procalcitonin Check for INFL -follow up cultures   GI/Nutrition GI PROPHYLAXIS as indicated DIET-->NPO Constipation protocol as indicated  ENDO - ICU hypoglycemic\Hyperglycemia protocol -check FSBS per protocol   ELECTROLYTES -follow labs as needed -replace as needed -pharmacy consultation and following   DVT/GI PRX ordered TRANSFUSIONS AS NEEDED MONITOR FSBS ASSESS the need for LABS as needed   Critical Care Time devoted to patient care services described in this note is 56 minutes.   Overall, patient is critically ill, prognosis is guarded.  Patient with Multiorgan failure and at high risk for cardiac arrest and death.    Corrin Parker, M.D.  [facility]  Medical Director [place] Director [facility] Cardio-Pulmonary Department

## 2018-08-25 NOTE — ED Notes (Signed)
2 pulse CPR resumed

## 2018-08-25 NOTE — ED Notes (Signed)
ET tube placement changed due to radiologist read of DG. 20 at the lip for replacement of intubation tube

## 2018-08-25 NOTE — Progress Notes (Signed)
Patient transported to CT and then transported to the CCU on the transport vent.

## 2018-08-25 NOTE — H&P (Signed)
[employer] - [place] at [facility]   PATIENT NAME: Laura [name]    MR#:  [id number]  DATE OF BIRTH:  11-24-1927  DATE OF ADMISSION:  09/03/2018  PRIMARY CARE PHYSICIAN: Duanne Limerick, MD   REQUESTING/REFERRING PHYSICIAN: dr Don Perking  CHIEF COMPLAINT:   Cardiac arrrest HISTORY OF PRESENT ILLNESS:  Laura [name]  is a 85 y.o. female with a known history of chronic kidney disease stage III and diabetes who presents after cardiac arrest.  Patient was seen in urgent care this morning due to shortness of breath where she was diagnosed with upper respiratory symptoms.  While at the lab patient had a syncopal event and was found to have no pulse.  CPR was started.  Patient arrived via EMS and she had received 2 doses of epinephrine.  She was initially in PEA arrest.  She was intubated.  She underwent CPR 2 more times while in the emergency room.  She is on several pressors.  She was given TPA for possible PE.  CT scan does not show evidence of PE.  She is currently intubated and sedated.  PAST MEDICAL HISTORY:   Past Medical History:  Diagnosis Date  . Anemia   . Chronic kidney disease   . Diabetes mellitus without complication (HCC)   . GERD (gastroesophageal reflux disease)   . Gout   . Hypertension     PAST SURGICAL HISTORY:   Past Surgical History:  Procedure Laterality Date  . ROTATOR CUFF REPAIR Left   . VAGINAL HYSTERECTOMY      SOCIAL HISTORY:   Social History   Tobacco Use  . Smoking status: Never Smoker  . Smokeless tobacco: Never Used  Substance Use Topics  . Alcohol use: No    Alcohol/week: 0.0 standard drinks    FAMILY HISTORY:   Family History  Problem Relation Age of Onset  . Diabetes Mother   . Cancer Sister     DRUG ALLERGIES:   Allergies  Allergen Reactions  . Angiotensin Receptor Blockers     REVIEW OF SYSTEMS:   Review of Systems  Unable to perform ROS: Acuity of condition    MEDICATIONS AT HOME:   Prior to  Admission medications   Medication Sig Start Date End Date Taking? Authorizing Provider  dorzolamide (TRUSOPT) 2 % ophthalmic solution Apply 1 drop to eye daily at 6 (six) AM. 09/27/13  Yes [provider]  timolol (TIMOPTIC) 0.5 % ophthalmic solution Place 1 drop into both eyes every morning. 04/30/15  Yes [provider]  acetaminophen (TYLENOL) 325 MG tablet Take 650 mg by mouth every 4 (four) hours as needed.    [provider]  allopurinol (ZYLOPRIM) 100 MG tablet Take 1 tablet (100 mg total) by mouth daily. 09/06/2018   Duanne Limerick, MD  amLODipine (NORVASC) 10 MG tablet Take 10 mg by mouth daily at 6 (six) AM.    [provider]  amLODipine (NORVASC) 5 MG tablet TAKE 1 TABLET(5 MG) BY MOUTH DAILY 08/24/2018   Duanne Limerick, MD  brinzolamide (AZOPT) 1 % ophthalmic suspension Apply 1 drop to eye 2 (two) times daily.    [provider]  ferrous sulfate 325 (65 FE) MG tablet Take 1 tablet by mouth daily. otc 02/19/14   [provider]  furosemide (LASIX) 40 MG tablet Take 1 tablet by mouth daily. Dr Thedore Mins 03/02/17   [provider]  glimepiride (AMARYL) 4 MG tablet Take 1 tablet by mouth daily. [employer]  [provider]  hydrochlorothiazide (HYDRODIURIL) 25 MG tablet Take 25 mg by mouth daily at 6 (six) AM.    [provider]  meloxicam (MOBIC) 15 MG tablet Dedra Skeensodd Mundy 01/08/15   [provider]  metoprolol tartrate (LOPRESSOR) 25 MG tablet Take 1 tablet (25 mg total) by mouth 2 (two) times daily. 09/15/2018   Duanne LimerickJones, Deanna C, MD  montelukast (SINGULAIR) 10 MG tablet Take 1 tablet (10 mg total) by mouth at bedtime. 09/09/2018   Duanne LimerickJones, Deanna C, MD  pantoprazole (PROTONIX) 40 MG tablet Take 1 tablet (40 mg total) by mouth daily. 09/06/2018   Duanne LimerickJones, Deanna C, MD  pioglitazone (ACTOS) 15 MG tablet Take 1 tablet by mouth daily. [facility]    [provider]  quinapril (ACCUPRIL) 10 MG tablet Take 1 tablet  (10 mg total) by mouth daily. Dr Thedore MinsSingh 08/28/2018   Duanne LimerickJones, Deanna C, MD      VITAL SIGNS:  Blood pressure (!) 135/54, pulse 78, temperature (!) 93 F (33.9 C), resp. rate (!) 21, height 5\' 2"  (1.575 m), weight 60.8 kg, SpO2 94 %.  PHYSICAL EXAMINATION:   Physical Exam Constitutional:      General: She is not in acute distress.    Comments: Patient intubated and sedated  HENT:     Head: Normocephalic.  Eyes:     General: No scleral icterus.    Conjunctiva/sclera: Conjunctivae normal.  Neck:     Musculoskeletal: Normal range of motion and neck supple.     Vascular: No JVD.     Trachea: No tracheal deviation.  Cardiovascular:     Rate and Rhythm: Normal rate and regular rhythm.     Heart sounds: Normal heart sounds. No murmur. No friction rub. No gallop.   Pulmonary:     Effort: Pulmonary effort is normal. No respiratory distress.     Breath sounds: Normal breath sounds. No wheezing or rales.     Comments: Intubated and sedated with normal respiratory effort no crackles rales or rhonchi Chest:     Chest wall: No tenderness.  Abdominal:     General: Bowel sounds are normal. There is no distension.     Palpations: Abdomen is soft. There is no mass.     Tenderness: There is no abdominal tenderness. There is no guarding or rebound.  Skin:    General: Skin is warm.     Findings: No erythema or rash.  Neurological:     Mental Status: She is alert.     Comments: Sedated on vent       LABORATORY PANEL:   CBC Recent Labs  Lab July 30, 2019 1311  July 30, 2019 1351  WBC 11.1*  --   --   HGB 8.7*   < > 7.1*  HCT 29.4*   < > 21.0*  PLT 172  --   --    < > = values in this interval not displayed.   ------------------------------------------------------------------------------------------------------------------  Chemistries  Recent Labs  Lab July 30, 2019 1311  July 30, 2019 1351  NA 141   < > 136  K 6.2*   < > 3.8  CL 102   < > 104  CO2 19*  --   --   GLUCOSE 286*   < > 373*  BUN 29*    < > 24*  CREATININE 2.03*   < > 1.80*  CALCIUM 14.8*  --   --   AST 1,308*  --   --   ALT 1,211*  --   --  ALKPHOS 50  --   --   BILITOT 0.6  --   --    < > = values in this interval not displayed.   ------------------------------------------------------------------------------------------------------------------  Cardiac Enzymes Recent Labs  Lab 09/09/2018 1311  TROPONINI 0.04*   ------------------------------------------------------------------------------------------------------------------  RADIOLOGY:  Ct Angio Chest Pe W And/or Wo Contrast  Result Date: 08/22/2018 CLINICAL DATA:  C/o SOB and cough, followed by cardiac arrest EXAM: CT ANGIOGRAPHY CHEST WITH CONTRAST TECHNIQUE: Multidetector CT imaging of the chest was performed using the standard protocol during bolus administration of intravenous contrast. Multiplanar CT image reconstructions and MIPs were obtained to evaluate the vascular anatomy. CONTRAST:  29mL OMNIPAQUE IOHEXOL 350 MG/ML SOLN COMPARISON:  Current chest radiograph. FINDINGS: Cardiovascular: There is satisfactory opacification of the pulmonary arteries to the segmental level. There is no evidence of a pulmonary embolism. Heart is mildly enlarged. There are three-vessel coronary artery calcifications. No pericardial effusion. Great vessels are normal in caliber. No aortic dissection minimal aortic atherosclerosis. Mediastinum/Nodes: No neck base, axillary, mediastinal or hilar masses or pathologically enlarged lymph nodes. Trachea is patent. Endotracheal tube tip projects 13 mm above the Carina. Nasal/orogastric tube passes below the diaphragm well into the stomach. Lungs/Pleura: Small, right greater than left, pleural effusions. There is dependent airspace opacity in both lungs, right greater than left. At least a component of this is presumably atelectasis. There is additional patchy peribronchovascular type airspace consolidation, both confluent and ground-glass, and  both lungs, greater on the right, as well as bilateral interstitial thickening that is most evident in the upper lobes. No pneumothorax. Upper Abdomen: No acute abnormality. Musculoskeletal: No fracture or acute finding. No osteoblastic or osteolytic lesions. Review of the MIP images confirms the above findings. IMPRESSION: 1. No evidence of a pulmonary embolism. 2. Dependent bilateral lung consolidation/atelectasis. Atelectasis is presumably a component of the dependent opacity, but there are findings also consistent with pneumonia, particularly on the right. In addition, there are patchy areas of confluent and ground-glass opacities as well as interstitial thickening and small bilateral pleural effusions that are consistent with pulmonary edema. 3. Mild cardiomegaly and three-vessel coronary artery calcifications. Minor aortic atherosclerosis. 4. Support apparatus is well positioned. Aortic Atherosclerosis (ICD10-I70.0). Electronically Signed   By: Amie Portland M.D.   On: 09/01/2018 14:39   Dg Chest Portable 1 View  Result Date: 09/09/2018 CLINICAL DATA:  Intubation. EXAM: PORTABLE CHEST 1 VIEW COMPARISON:  Chest x-ray 10/22/2012. FINDINGS: Endotracheal tube tip noted at the level of the carina, proximal repositioning of approximately 4 cm suggested. NG tube noted with tip below left hemidiaphragm. Cardiomegaly. Diffuse bilateral pulmonary infiltrates/edema. Cardiac B-lines noted. Findings most consistent with congestive heart failure with bilateral pulmonary edema. IMPRESSION: 1. Endotracheal tube tip noted just above the carina. Proximal repositioning of approximately 4 cm suggested. NG tube noted with tip below left hemidiaphragm. 2. Findings consistent with congestive heart failure with bilateral pulmonary edema. Critical Value/emergent results were called by telephone at the time of interpretation on 08/30/2018 at 1:48 pm to Dr. Wyline Mood, who verbally acknowledged these results. Electronically Signed   By:  Maisie Fus  Register   On: 08/19/2018 13:52    EKG:  Left bundle branch block without ST elevation or depression  IMPRESSION AND PLAN:   85 year old female with history of diabetes and hypertension who presented to urgent care with shortness of breath and diagnosed with upper respiratory infection and subsequently had a cardiac arrest.  1.  Cardiac/respiratory arrest: Patient is currently intubated. She is status post TPA in  the emergency room. Case discussed with intensivist Continue ventilator settings per intensivist. Cardiology consultation requested via epic to Dr. Juliann Pares Monitor troponins Monitor telemetry Continue heparin drip Monitor CBC and comprehensive panel Follow-up on echocardiogram Continue pressors keep map greater than 65 2.  Hyperkalemia: This was treated and has normalized  3.  Acute kidney injury in the setting of CPR/cardiac arrest: Continue IV fluids 4.  Elevated LFTs due to cardiac arrest Repeat in a.m.  5.  Acute on chronic anemia: Patient is status post TPA and is currently on heparin drip.  CBC should be monitored closely.   6.  Pneumonia on CT scan: Continue broad-spectrum antibiotics Order MRSA PCR   Patient critically ill.  Palliative care consultation placed.  All the records are reviewed and case discussed with ED provider.   CODE STATUS: FULL as per family  Critical care TOTAL TIME TAKING CARE OF THIS PATIENT: 70 minutes.    Bradly Sangiovanni M.D on 09/14/2018 at 3:02 PM  Between 7am to 6pm - Pager - [phone]  After 6pm go to [email or web address] - password [email or web address]  [facility]  Office  [phone]  CC: Primary care physician; Duanne Limerick, MD

## 2018-08-25 NOTE — ED Notes (Signed)
Pt currently being paced at 33mA

## 2018-08-25 NOTE — Progress Notes (Signed)
Patient Laura 0.30 and INR 10, NP Blakeney notified. Patient core temp 34 NP Blakeny aware. Room temp increased and warm blankets placed on patient.

## 2018-08-25 NOTE — ED Notes (Signed)
Epi given by RN gracie

## 2018-08-25 NOTE — ED Triage Notes (Addendum)
Pt arrives from urgent care where pt had syncopal episode and lost pulses. CPR was initated in field. Pt given 2 epi and atropine in field. Pt arrives being bagged by EMS and CPR in progress

## 2018-08-25 NOTE — ED Notes (Signed)
Pt's saturation dropped to 78%, bagging initiated by Seward Speck, RT and MD called

## 2018-08-25 NOTE — ED Notes (Signed)
External pacer discontinued by Dr. Don Perking. Pt heart rate stable in the 70's. Will continue to monitor pt.

## 2018-08-25 NOTE — Progress Notes (Signed)
Pharmacy Antibiotic Note  Laura [name] is a 85 y.o. female admitted on 09/15/2018 with pneumonia.  Pharmacy has been consulted for cefepime and vancomycin dosing.  Plan: Will order cefepime 1 g q24H   Will order a vancomycin 1500 mg x1 loading dose followed by vancomycin 750 mg q48H maintenance dose. Predicted AUC 461. Goal AUC 400-550. Obtain levels at 4th or 5th day.   Vd 43.8, Ke 0.019, t1/2 37.3. Scr 1.8 used for calculations.   Height: 5\' 2"  (157.5 cm) Weight: 134 lb (60.8 kg) IBW/kg (Calculated) : 50.1  Temp (24hrs), Avg:94.8 F (34.9 C), Min:92.5 F (33.6 C), Max:96.8 F (36 C)  Recent Labs  Lab 09/10/2018 1311 09/16/2018 1314 09/08/2018 1351 09/13/2018 1450  WBC 11.1*  --   --  12.0*  CREATININE 2.03* 1.80* 1.80*  --     Estimated Creatinine Clearance: 18.6 mL/min (A) (by C-G formula based on SCr of 1.8 mg/dL (H)).    Allergies  Allergen Reactions  . Angiotensin Receptor Blockers     Antimicrobials this admission: 1/9 cefepime >>  1/9 vancomycin >>  1/9 azithro + ceftriaxone x 1   Dose adjustments this admission: Cefepime was adjusted per renal function   Microbiology results: None  Thank you for allowing pharmacy to be a part of this patient's care.  Ronnald RampKishan S Starlit Raburn, PharmD, BCPS Clinical Pharmacist 09/05/2018 3:18 PM

## 2018-08-25 NOTE — Progress Notes (Signed)
Date:  08/24/2018   Name:  Carroll SageJosephine C Curtner   DOB:  June 27, 1928   MRN:  [id number]   Chief Complaint: Gastroesophageal Reflux; Hypertension (Dr Thedore MinsSingh increased Quinapril to 10mg  qday- it is not in her bag); Gout; and Sinusitis  Patient on allopurinol 100mg  daily for hyperuricacidemia  Gastroesophageal Reflux  She complains of coughing. She reports no abdominal pain, no belching, no chest pain, no choking, no dysphagia, no early satiety, no globus sensation, no heartburn, no hoarse voice, no nausea, no sore throat or no wheezing. This is a chronic problem. The current episode started more than 1 year ago. The problem occurs occasionally. The problem has been gradually improving. The symptoms are aggravated by certain foods. Pertinent negatives include no anemia, fatigue, melena, muscle weakness, orthopnea or weight loss. There are no known risk factors. She has tried a PPI for the symptoms. The treatment provided mild relief.  Hypertension  This is a chronic problem. The current episode started more than 1 year ago. The problem is unchanged. Pertinent negatives include no anxiety, blurred vision, chest pain, headaches, malaise/fatigue, neck pain, orthopnea, palpitations, peripheral edema, PND, shortness of breath or sweats. Past treatments include ACE inhibitors and beta blockers. The current treatment provides moderate improvement. There are no compliance problems.  There is no history of angina, kidney disease, CAD/MI, CVA, heart failure, left ventricular hypertrophy, PVD or retinopathy. There is no history of chronic renal disease, a hypertension causing med or renovascular disease.  Sinusitis  This is a chronic problem. The current episode started in the past 7 days. The problem is unchanged. There has been no fever. Associated symptoms include congestion, coughing, ear pain and sinus pressure. Pertinent negatives include no chills, diaphoresis, headaches, hoarse voice, neck pain, shortness of  breath, sneezing, sore throat or swollen glands. The treatment provided mild relief.    Review of Systems  Constitutional: Negative.  Negative for chills, diaphoresis, fatigue, fever, malaise/fatigue, unexpected weight change and weight loss.  HENT: Positive for congestion, ear pain and sinus pressure. Negative for ear discharge, hoarse voice, rhinorrhea, sneezing and sore throat.   Eyes: Negative for blurred vision, photophobia, pain, discharge, redness and itching.  Respiratory: Positive for cough. Negative for choking, shortness of breath, wheezing and stridor.   Cardiovascular: Negative for chest pain, palpitations, orthopnea and PND.  Gastrointestinal: Negative for abdominal pain, blood in stool, constipation, diarrhea, dysphagia, heartburn, melena, nausea and vomiting.  Endocrine: Negative for cold intolerance, heat intolerance, polydipsia, polyphagia and polyuria.  Genitourinary: Negative for dysuria, flank pain, frequency, hematuria, menstrual problem, pelvic pain, urgency, vaginal bleeding and vaginal discharge.  Musculoskeletal: Negative for arthralgias, back pain, myalgias, muscle weakness and neck pain.  Skin: Negative for rash.  Allergic/Immunologic: Negative for environmental allergies and food allergies.  Neurological: Negative for dizziness, weakness, light-headedness, numbness and headaches.  Hematological: Negative for adenopathy. Does not bruise/bleed easily.  Psychiatric/Behavioral: Negative for dysphoric mood. The patient is not nervous/anxious.     Patient Active Problem List   Diagnosis Date Noted  . Type 2 diabetes mellitus with complication, without long-term current use of insulin (HCC) 01/11/2018  . Hyperuricemia 03/17/2017  . Adult hypothyroidism 03/17/2017  . Diabetes mellitus with no complication (HCC) 02/21/2015  . Familial multiple lipoprotein-type hyperlipidemia 02/21/2015  . Venous (peripheral) insufficiency 02/21/2015  . Absolute anemia 02/21/2015  .  Angioedema of lips 02/21/2015  . Essential (primary) hypertension 02/21/2015  . Gastro-esophageal reflux disease without esophagitis 02/21/2015  . H/O: glaucoma 02/21/2015  . Arthritis due to gout  02/21/2015  . Follow-up examination, following other surgery 02/21/2015  . Flu vaccine need 02/21/2015  . Menopause 02/21/2015  . Neurocardiogenic syncope 02/21/2015  . Diabetes mellitus, type 2 (HCC) 06/21/2014    Allergies  Allergen Reactions  . Angiotensin Receptor Blockers     Past Surgical History:  Procedure Laterality Date  . ROTATOR CUFF REPAIR Left   . VAGINAL HYSTERECTOMY      Social History   Tobacco Use  . Smoking status: Never Smoker  . Smokeless tobacco: Never Used  Substance Use Topics  . Alcohol use: No    Alcohol/week: 0.0 standard drinks  . Drug use: No     Medication list has been reviewed and updated.  Current Meds  Medication Sig  . allopurinol (ZYLOPRIM) 100 MG tablet TAKE 1 TABLET BY MOUTH EVERY DAY  . amLODipine (NORVASC) 5 MG tablet TAKE 1 TABLET(5 MG) BY MOUTH DAILY  . ferrous sulfate 325 (65 FE) MG tablet Take 1 tablet by mouth daily. otc  . furosemide (LASIX) 40 MG tablet Take 1 tablet by mouth daily. Dr Thedore MinsSingh  . glimepiride (AMARYL) 4 MG tablet Take 1 tablet by mouth daily. [employer]  . meloxicam (MOBIC) 15 MG tablet Dedra Skeensodd Mundy  . metoprolol tartrate (LOPRESSOR) 25 MG tablet Take 1 tablet by mouth 2 (two) times daily.  . pantoprazole (PROTONIX) 40 MG tablet TAKE 1 TABLET BY MOUTH EVERY DAY  . pioglitazone (ACTOS) 15 MG tablet Take 1 tablet by mouth daily. [employer]  . quinapril (ACCUPRIL) 10 MG tablet Take by mouth daily. Dr Thedore MinsSingh    [facility] 01/11/2018 10/19/2017 03/17/2017 01/16/2016  PHQ - 2 Score 0 0 2 0  PHQ- 9 Score 0 0 2 -    Physical Exam Vitals signs and nursing note reviewed.  Constitutional:      General: She is not in acute distress.    Appearance: She is not diaphoretic.  HENT:     Head: Normocephalic and  atraumatic.     Right Ear: External ear normal.     Left Ear: External ear normal.     Nose: Nose normal.  Eyes:     General:        Right eye: No discharge.        Left eye: No discharge.     Conjunctiva/sclera: Conjunctivae normal.     Pupils: Pupils are equal, round, and reactive to light.  Neck:     Musculoskeletal: Normal range of motion and neck supple.     Thyroid: No thyromegaly.     Vascular: No JVD.  Cardiovascular:     Rate and Rhythm: Normal rate and regular rhythm.     Chest Wall: PMI is not displaced.     Pulses: Normal pulses.     Heart sounds: Normal heart sounds, S1 normal and S2 normal. Heart sounds not distant. No murmur. No systolic murmur. No diastolic murmur. No friction rub. No gallop. No S3 or S4 sounds.   Pulmonary:     Effort: Pulmonary effort is normal.     Breath sounds: Normal breath sounds. No wheezing, rhonchi or rales.  Abdominal:     General: Bowel sounds are normal.     Palpations: Abdomen is soft. There is no mass.     Tenderness: There is no abdominal tenderness. There is no guarding.  Musculoskeletal: Normal range of motion.     Right lower leg: No edema.     Left lower leg: No edema.  Lymphadenopathy:  Cervical: No cervical adenopathy.  Skin:    General: Skin is warm and dry.  Neurological:     Mental Status: She is alert.     Deep Tendon Reflexes: Reflexes are normal and symmetric.     BP 140/60   Pulse 68   Ht 5\' 2"  (1.575 m)   Wt 134 lb (60.8 kg)   BMI 24.51 kg/m   Assessment and Plan:  1. Essential (primary) hypertension Chronic. Controlled on current dose. Quinapril was increased by Dr. Thedore Mins- refill meds as prescribed. Renal panel was obtained by Dr Thedore Mins - amLODipine (NORVASC) 5 MG tablet; TAKE 1 TABLET(5 MG) BY MOUTH DAILY  Dispense: 90 tablet; Refill: 1 - quinapril (ACCUPRIL) 10 MG tablet; Take 1 tablet (10 mg total) by mouth daily. Dr Thedore Mins  Dispense: 90 tablet; Refill: 1 - metoprolol tartrate (LOPRESSOR) 25 MG  tablet; Take 1 tablet (25 mg total) by mouth 2 (two) times daily.  Dispense: 180 tablet; Refill: 1  2. Gastroesophageal reflux disease, esophagitis presence not specified Chronic. Stable on meds- refill Pantoprazole - pantoprazole (PROTONIX) 40 MG tablet; Take 1 tablet (40 mg total) by mouth daily.  Dispense: 90 tablet; Refill: 1  3. Hyperuricemia Chronic. Stable on meds- refill Allopurinol - allopurinol (ZYLOPRIM) 100 MG tablet; Take 1 tablet (100 mg total) by mouth daily.  Dispense: 90 tablet; Refill: 1  4. Viral upper respiratory tract infection Acute. Complained of scratchy throat and congestion after taking trip over holidays. Start Montelukast for 2 weeks.  - montelukast (SINGULAIR) 10 MG tablet; Take 1 tablet (10 mg total) by mouth at bedtime.  Dispense: 14 tablet; Refill: 0  5. Influenza vaccine needed Discussed and administered - Flu vaccine HIGH DOSE PF  6. Moderate mixed hyperlipidemia not requiring statin therapy Draw lipid for cholesterol monitoring - Lipid panel

## 2018-08-25 NOTE — ED Provider Notes (Signed)
[facility] Emergency Department Provider Note  ____________________________________________  Time seen: Approximately 2:18 PM  I have reviewed the triage vital signs and the nursing notes.   HISTORY  Chief Complaint CPR  Level 5 caveat:  Portions of the history and physical were unable to be obtained due to CPR ongoing   HPI Laura [name] is a 85 y.o. female with h/o anemia, HFpEF, diabetes, chronic kidney disease, hypertension, GERD who presents via EMS for cardiac arrest.  Patient apparently called family earlier today complaining of difficulty breathing.  Went to urgent care where she was complaining of cough, shortness of breath, and URI symptoms.  While at the lab patient had a syncopal event and was found to have no pulse.  CPR was started immediately.  When EMS arrived patient had already received 2 epis.  Initial rhythm on EMSs monitor was PEA.  Patient was intubated with a King airway and achieved ROSC in the field. Patient again lost pulses in route and arrived to the ED with ongoing CPR.  Past Medical History:  Diagnosis Date  . Anemia   . Chronic kidney disease   . Diabetes mellitus without complication (HCC)   . GERD (gastroesophageal reflux disease)   . Gout   . Hypertension     Patient Active Problem List   Diagnosis Date Noted  . Cardiac arrest (HCC) 09-09-18  . Type 2 diabetes mellitus with complication, without long-term current use of insulin (HCC) 01/11/2018  . Hyperuricemia 03/17/2017  . Adult hypothyroidism 03/17/2017  . Diabetes mellitus with no complication (HCC) 02/21/2015  . Familial multiple lipoprotein-type hyperlipidemia 02/21/2015  . Venous (peripheral) insufficiency 02/21/2015  . Absolute anemia 02/21/2015  . Angioedema of lips 02/21/2015  . Essential (primary) hypertension 02/21/2015  . Gastro-esophageal reflux disease without esophagitis 02/21/2015  . H/O: glaucoma 02/21/2015  . Arthritis due to gout  02/21/2015  . Follow-up examination, following other surgery 02/21/2015  . Flu vaccine need 02/21/2015  . Menopause 02/21/2015  . Neurocardiogenic syncope 02/21/2015  . Diabetes mellitus, type 2 (HCC) 06/21/2014    Past Surgical History:  Procedure Laterality Date  . ROTATOR CUFF REPAIR Left   . VAGINAL HYSTERECTOMY      Prior to Admission medications   Medication Sig Start Date End Date Taking? Authorizing Provider  dorzolamide (TRUSOPT) 2 % ophthalmic solution Apply 1 drop to eye daily at 6 (six) AM. 09/27/13  Yes [provider]  timolol (TIMOPTIC) 0.5 % ophthalmic solution Place 1 drop into both eyes every morning. 04/30/15  Yes [provider]  acetaminophen (TYLENOL) 325 MG tablet Take 650 mg by mouth every 4 (four) hours as needed.    [provider]  allopurinol (ZYLOPRIM) 100 MG tablet Take 1 tablet (100 mg total) by mouth daily. 09/09/2018   Duanne Limerick, MD  amLODipine (NORVASC) 10 MG tablet Take 10 mg by mouth daily at 6 (six) AM.    [provider]  amLODipine (NORVASC) 5 MG tablet TAKE 1 TABLET(5 MG) BY MOUTH DAILY 09/09/18   Duanne Limerick, MD  brinzolamide (AZOPT) 1 % ophthalmic suspension Apply 1 drop to eye 2 (two) times daily.    [provider]  ferrous sulfate 325 (65 FE) MG tablet Take 1 tablet by mouth daily. otc 02/19/14   [provider]  furosemide (LASIX) 40 MG tablet Take 1 tablet by mouth daily. Dr Thedore Mins 03/02/17   [provider]  glimepiride (AMARYL) 4 MG tablet Take 1 tablet by mouth  daily. [facility]    [provider]  hydrochlorothiazide (HYDRODIURIL) 25 MG tablet Take 25 mg by mouth daily at 6 (six) AM.    [provider]  meloxicam (MOBIC) 15 MG tablet Dedra Skeens 01/08/15   [provider]  metoprolol tartrate (LOPRESSOR) 25 MG tablet Take 1 tablet (25 mg total) by mouth 2 (two) times daily. 08/31/2018   Duanne Limerick, MD  montelukast (SINGULAIR) 10 MG tablet Take 1  tablet (10 mg total) by mouth at bedtime. 09/08/2018   Duanne Limerick, MD  pantoprazole (PROTONIX) 40 MG tablet Take 1 tablet (40 mg total) by mouth daily. 08/26/2018   Duanne Limerick, MD  pioglitazone (ACTOS) 15 MG tablet Take 1 tablet by mouth daily. [facility]    [provider]  quinapril (ACCUPRIL) 10 MG tablet Take 1 tablet (10 mg total) by mouth daily. Dr Thedore Mins 08/24/2018   Duanne Limerick, MD    Allergies Angiotensin receptor blockers  Family History  Problem Relation Age of Onset  . Diabetes Mother   . Cancer Sister     Social History Social History   Tobacco Use  . Smoking status: Never Smoker  . Smokeless tobacco: Never Used  Substance Use Topics  . Alcohol use: No    Alcohol/week: 0.0 standard drinks  . Drug use: No    Review of Systems Respiratory: + shortness of breath ____________________________________________   PHYSICAL EXAM:  VITAL SIGNS: ED Triage Vitals  Enc Vitals Group     BP 09/11/2018 1314 (!) 191/100     Pulse Rate 08/28/2018 1307 (!) 40     Resp 09/04/2018 1318 15     Temp 09/06/2018 1321 (!) 92.5 F (33.6 C)     Temp Source 08/31/2018 1326 Bladder     SpO2 08/18/2018 1327 97 %     Weight 09/05/2018 1307 134 lb (60.8 kg)     Height 08/24/2018 1307 5\' 2"  (1.575 m)     Head Circumference --      Peak Flow --      Pain Score --      Pain Loc --      Pain Edu? --      Excl. in GC? --     Constitutional: Intubated, ongoing CPR. HEENT:      Head: Normocephalic and atraumatic.         Eyes: Pupils 2 mm and sluggish      Mouth/Throat: King airway Cardiovascular: Tachycardic with regular rhythm, no murmurs. Respiratory: bilateral breath sounds Gastrointestinal: Soft, non distended with positive bowel sounds.  Musculoskeletal: No edema, cyanosis, or erythema of extremities. Neurologic: GCS3 Skin: Skin is warm, dry and intact. No rash noted.  ____________________________________________   LABS (all labs ordered are listed, but only abnormal  results are displayed)  Labs Reviewed  TROPONIN I - Abnormal; Notable for the following components:      Result Value   Troponin I 0.04 (*)    All other components within normal limits  CBC WITH DIFFERENTIAL/PLATELET - Abnormal; Notable for the following components:   WBC 11.1 (*)    RBC 3.11 (*)    Hemoglobin 8.7 (*)    HCT 29.4 (*)    MCHC 29.6 (*)    nRBC 0.3 (*)    Lymphs Abs 6.7 (*)    Abs Immature Granulocytes 0.17 (*)    All other components within normal limits  COMPREHENSIVE METABOLIC PANEL - Abnormal; Notable for the following components:   Potassium  6.2 (*)    CO2 19 (*)    Glucose, Bld 286 (*)    BUN 29 (*)    Creatinine, Ser 2.03 (*)    Calcium 14.8 (*)    Total Protein 6.2 (*)    Albumin 2.8 (*)    AST 1,308 (*)    ALT 1,211 (*)    GFR calc non Af Amer 21 (*)    GFR calc Af Amer 25 (*)    Anion gap 20 (*)    All other components within normal limits  CBC - Abnormal; Notable for the following components:   WBC 12.0 (*)    RBC 2.90 (*)    Hemoglobin 8.2 (*)    HCT 26.8 (*)    All other components within normal limits  BLOOD GAS, ARTERIAL - Abnormal; Notable for the following components:   pH, Arterial 7.34 (*)    pCO2 arterial 28 (*)    pO2, Arterial 371 (*)    Bicarbonate 15.1 (*)    Acid-base deficit 9.2 (*)    All other components within normal limits  POCT I-STAT, CHEM 8 - Abnormal; Notable for the following components:   Potassium 6.0 (*)    BUN 26 (*)    Creatinine, Ser 1.80 (*)    Glucose, Bld 272 (*)    Calcium, Ion 2.00 (*)    Hemoglobin 9.2 (*)    HCT 27.0 (*)    All other components within normal limits  POCT I-STAT, CHEM 8 - Abnormal; Notable for the following components:   BUN 24 (*)    Creatinine, Ser 1.80 (*)    Glucose, Bld 373 (*)    Calcium, Ion 1.48 (*)    TCO2 18 (*)    Hemoglobin 7.1 (*)    HCT 21.0 (*)    All other components within normal limits  MRSA PCR SCREENING  PROTIME-INR  APTT  APTT  PROTIME-INR  INFLUENZA  PANEL BY PCR (TYPE A & B)  I-STAT CG4 LACTIC ACID, ED   ____________________________________________  EKG  13:08 -atrial fibrillation, left bundle branch block, prolonged QTC, no ST elevations or depressions, TWI in lateral leads.  A. fib is new when compared to prior.  Left bundle was already present  13:19 -normal sinus rhythm, rate of 61, left bundle branch block, normal QTC, normal axis, T wave inversions in lateral leads with no ST elevations or depressions.  Unchanged from prior. ____________________________________________  RADIOLOGY  I have personally reviewed the images performed during this visit and I agree with the Radiologist's read.   Interpretation by Radiologist:  Ct Angio Chest Pe W And/or Wo Contrast  Result Date: 08/19/2018 CLINICAL DATA:  C/o SOB and cough, followed by cardiac arrest EXAM: CT ANGIOGRAPHY CHEST WITH CONTRAST TECHNIQUE: Multidetector CT imaging of the chest was performed using the standard protocol during bolus administration of intravenous contrast. Multiplanar CT image reconstructions and MIPs were obtained to evaluate the vascular anatomy. CONTRAST:  60mL OMNIPAQUE IOHEXOL 350 MG/ML SOLN COMPARISON:  Current chest radiograph. FINDINGS: Cardiovascular: There is satisfactory opacification of the pulmonary arteries to the segmental level. There is no evidence of a pulmonary embolism. Heart is mildly enlarged. There are three-vessel coronary artery calcifications. No pericardial effusion. Great vessels are normal in caliber. No aortic dissection minimal aortic atherosclerosis. Mediastinum/Nodes: No neck base, axillary, mediastinal or hilar masses or pathologically enlarged lymph nodes. Trachea is patent. Endotracheal tube tip projects 13 mm above the Carina. Nasal/orogastric tube passes below the diaphragm well into the  stomach. Lungs/Pleura: Small, right greater than left, pleural effusions. There is dependent airspace opacity in both lungs, right greater than  left. At least a component of this is presumably atelectasis. There is additional patchy peribronchovascular type airspace consolidation, both confluent and ground-glass, and both lungs, greater on the right, as well as bilateral interstitial thickening that is most evident in the upper lobes. No pneumothorax. Upper Abdomen: No acute abnormality. Musculoskeletal: No fracture or acute finding. No osteoblastic or osteolytic lesions. Review of the MIP images confirms the above findings. IMPRESSION: 1. No evidence of a pulmonary embolism. 2. Dependent bilateral lung consolidation/atelectasis. Atelectasis is presumably a component of the dependent opacity, but there are findings also consistent with pneumonia, particularly on the right. In addition, there are patchy areas of confluent and ground-glass opacities as well as interstitial thickening and small bilateral pleural effusions that are consistent with pulmonary edema. 3. Mild cardiomegaly and three-vessel coronary artery calcifications. Minor aortic atherosclerosis. 4. Support apparatus is well positioned. Aortic Atherosclerosis (ICD10-I70.0). Electronically Signed   By: Amie Portlandavid  Ormond M.D.   On: 09/11/2018 14:39   Dg Chest Portable 1 View  Result Date: 09/07/2018 CLINICAL DATA:  Intubation. EXAM: PORTABLE CHEST 1 VIEW COMPARISON:  Chest x-ray 10/22/2012. FINDINGS: Endotracheal tube tip noted at the level of the carina, proximal repositioning of approximately 4 cm suggested. NG tube noted with tip below left hemidiaphragm. Cardiomegaly. Diffuse bilateral pulmonary infiltrates/edema. Cardiac B-lines noted. Findings most consistent with congestive heart failure with bilateral pulmonary edema. IMPRESSION: 1. Endotracheal tube tip noted just above the carina. Proximal repositioning of approximately 4 cm suggested. NG tube noted with tip below left hemidiaphragm. 2. Findings consistent with congestive heart failure with bilateral pulmonary edema. Critical  Value/emergent results were called by telephone at the time of interpretation on 08/24/2018 at 1:48 pm to Dr. Wyline MoodVeranese, who verbally acknowledged these results. Electronically Signed   By: Maisie Fushomas  Register   On: 08/26/2018 13:52     ____________________________________________   PROCEDURES  Procedure(s) performed:yes Procedure Name: Intubation Date/Time: 09/12/2018 3:18 PM Performed by: Nita SickleVeronese, [place], MD Pre-anesthesia Checklist: Patient identified, Emergency Drugs available, Suction available and Patient being monitored Preoxygenation: Pre-oxygenation with 100% oxygen Induction Type: IV induction and Rapid sequence Laryngoscope Size: Glidescope Tube size: 7.5 mm Number of attempts: 1 Placement Confirmation: ETT inserted through vocal cords under direct vision,  CO2 detector and Breath sounds checked- equal and bilateral Secured at: 20 cm Tube secured with: ETT holder Dental Injury: Teeth and Oropharynx as per pre-operative assessment       Critical Care performed: yes  CRITICAL CARE Performed by: Nita Sicklearolina Qaadir Kent  ?  Total critical care time: 60 min  Critical care time was exclusive of separately billable procedures and treating other patients.  Critical care was necessary to treat or prevent imminent or life-threatening deterioration.  Critical care was time spent personally by me on the following activities: development of treatment plan with patient and/or surrogate as well as nursing, discussions with consultants, evaluation of patient's response to treatment, examination of patient, obtaining history from patient or surrogate, ordering and performing treatments and interventions, ordering and review of laboratory studies, ordering and review of radiographic studies, pulse oximetry and re-evaluation of patient's condition.  ____________________________________________   INITIAL IMPRESSION / ASSESSMENT AND PLAN / ED COURSE   85 y.o. female with h/o anemia, HFpEF,  diabetes, chronic kidney disease, hypertension, GERD who presents via EMS for cardiac arrest at urgent care after presenting with shortness of breath and cough.  Initial rhythm was  PEA arrest in the field.  Upon arrival to the emergency room patient with ongoing CPR.  PEA on cardiac monitoring.  ISTAT chem 8 showing K 6.0. Patient given calcium, bicarbonate, lasix, insulin, D50. After several rounds of epi patient achieved ROSC. Patient moving both upper extremities after ROSC, pupils reactive. Initially patient tachycardic with LBBB which was old when compared to prior EKG. King airway was exchanged to ETT after patient was given rocuronium and ketamine.  After intubation patient was very difficult to be oxygenated and had persistent hypoxia even after being taken off of the vent and bagged manually. CXR confirmed tube position, patient continued to have good strong bilateral breath sounds. CXR done showed no evidence of PTX.  Patient also persistently bradycardic and hypotensive.  Since patient is initial complaint was shortness of breath with PEA arrest the clinical concern was high for massive PE.  Patient was then given 100 mg of TPA over 15 minutes.  Atropine attempted with no improvement and external pacing was initiated with good capture. EKG with no acute ischemic changes. She was started on Precedex for sedation and norepi for hypotension.  Patient remained persistently hypotensive or nor epi after 2 L of fluid and was then started on vasopressin.    _________________________ 2:41 PM on 08/26/2018 -----------------------------------------  CTA negative for PE. Lungs with diffuse bilateral infiltrates, started on rocephin and azithromycin. Flu pending. Patient more stable on dopamine, able to be taken off external pacer. BP 135/87, HR 50s, sating 100%. Discussed with Dr. Juliene Pina at 2:30PM for admission to ICU. Code verified with patient's niece (next of kin) as FULL CODE.    As part of my medical  decision making, I reviewed the following data within the electronic MEDICAL RECORD NUMBER Nursing notes reviewed and incorporated, Labs reviewed , EKG interpreted , Old EKG reviewed, Old chart reviewed, Radiograph reviewed , Discussed with admitting physician , Notes from prior ED visits and [place] Controlled Substance Database    Pertinent labs & imaging results that were available during my care of the patient were reviewed by me and considered in my medical decision making (see chart for details).    ____________________________________________   FINAL CLINICAL IMPRESSION(S) / ED DIAGNOSES  Final diagnoses:  Cardiac arrest (HCC)  Acute respiratory failure with hypoxia (HCC)  Shock liver  Hyperkalemia  Community acquired pneumonia, unspecified laterality      NEW MEDICATIONS STARTED DURING THIS VISIT:  ED Discharge Orders    None       Note:  This document was prepared using Dragon voice recognition software and may include unintentional dictation errors.    Don Perking, [place], MD 09/05/2018 [phone]

## 2018-08-25 NOTE — ED Notes (Signed)
Bi carb given by Deboraha Sprang

## 2018-08-25 NOTE — ED Notes (Signed)
Pulse check, pulse present

## 2018-08-25 NOTE — Progress Notes (Signed)
Chaplain was rounding and learned of a CPR in progress. Chaplain sit with grand-niece(Laura [name]) while she contacted relatives. Pt has no spouse of children. Family lives outside of area except grand-niece. Chaplain practiced pastoral presence and encouragement. Niece left number to be contacted in case of needing contact.    2018/09/21 1400  Clinical Encounter Type  Visited With Patient;Family  Visit Type Code  Referral From Nurse  Spiritual Encounters  Spiritual Needs Prayer;Emotional

## 2018-08-25 NOTE — ED Notes (Signed)
Bi carb given by [employer]

## 2018-08-25 NOTE — ED Notes (Addendum)
ET Tube 7.5 by MD Don Perking 23 at the lip

## 2018-08-26 ENCOUNTER — Other Ambulatory Visit: Payer: Self-pay | Admitting: Family Medicine

## 2018-08-26 ENCOUNTER — Inpatient Hospital Stay: Payer: Medicare Other

## 2018-08-26 ENCOUNTER — Inpatient Hospital Stay
Admit: 2018-08-26 | Discharge: 2018-08-26 | Disposition: A | Discharge status: Home / Self Care | Payer: Medicare Other | Attending: Internal Medicine | Admitting: Internal Medicine

## 2018-08-26 LAB — LIPID PANEL
Chol/HDL Ratio: 2.2 ratio (ref 0.0–4.4)
Cholesterol, Total: 170 mg/dL (ref 100–199)
HDL: 79 mg/dL (ref >39)
LDL Calculated: 79 mg/dL (ref 0–99)
Triglycerides: 61 mg/dL (ref 0–149)
VLDL Cholesterol Cal: 12 mg/dL (ref 5–40)

## 2018-08-26 LAB — BLOOD GAS, ARTERIAL
Acid-base deficit: 1.4 mmol/L (ref 0.0–2.0)
Bicarbonate: 20.6 mmol/L (ref 20.0–28.0)
FIO2: 0.4
MECHVT: 500 mL
Mechanical Rate: 20
O2 Saturation: 99.5 %
PEEP: 5 cmH2O
Patient temperature: 37
pCO2 arterial: 27 mmHg — ABNORMAL LOW (ref 32.0–48.0)
pH, Arterial: 7.49 — ABNORMAL HIGH (ref 7.350–7.450)
pO2, Arterial: 150 mmHg — ABNORMAL HIGH (ref 83.0–108.0)

## 2018-08-26 LAB — COMPREHENSIVE METABOLIC PANEL
ALT: 2263 U/L — ABNORMAL HIGH (ref 0–44)
AST: 3875 U/L — ABNORMAL HIGH (ref 15–41)
Albumin: 2.7 g/dL — ABNORMAL LOW (ref 3.5–5.0)
Alkaline Phosphatase: 60 U/L (ref 38–126)
Anion gap: 12 (ref 5–15)
BUN: 32 mg/dL — ABNORMAL HIGH (ref 8–23)
CALCIUM: 9.2 mg/dL (ref 8.9–10.3)
CHLORIDE: 104 mmol/L (ref 98–111)
CO2: 21 mmol/L — AB (ref 22–32)
CREATININE: 1.85 mg/dL — AB (ref 0.44–1.00)
GFR calc Af Amer: 28 mL/min — ABNORMAL LOW (ref >60)
GFR calc non Af Amer: 24 mL/min — ABNORMAL LOW (ref >60)
Glucose, Bld: 230 mg/dL — ABNORMAL HIGH (ref 70–99)
Potassium: 3.8 mmol/L (ref 3.5–5.1)
Sodium: 137 mmol/L (ref 135–145)
Total Bilirubin: 0.9 mg/dL (ref 0.3–1.2)
Total Protein: 5.8 g/dL — ABNORMAL LOW (ref 6.5–8.1)

## 2018-08-26 LAB — CBC
HCT: 28 % — ABNORMAL LOW (ref 36.0–46.0)
Hemoglobin: 8.8 g/dL — ABNORMAL LOW (ref 12.0–15.0)
MCH: 28.5 pg (ref 26.0–34.0)
MCHC: 31.4 g/dL (ref 30.0–36.0)
MCV: 90.6 fL (ref 80.0–100.0)
Platelets: 177 10*3/uL (ref 150–400)
RBC: 3.09 MIL/uL — ABNORMAL LOW (ref 3.87–5.11)
RDW: 15.1 % (ref 11.5–15.5)
WBC: 14.1 10*3/uL — ABNORMAL HIGH (ref 4.0–10.5)
nRBC: 0 % (ref 0.0–0.2)

## 2018-08-26 LAB — PROCALCITONIN: Procalcitonin: 10.92 ng/mL

## 2018-08-26 LAB — GLUCOSE, CAPILLARY
GLUCOSE-CAPILLARY: 188 mg/dL — AB (ref 70–99)
Glucose-Capillary: 173 mg/dL — ABNORMAL HIGH (ref 70–99)
Glucose-Capillary: 161 mg/dL — ABNORMAL HIGH (ref 70–99)
Glucose-Capillary: 196 mg/dL — ABNORMAL HIGH (ref 70–99)
Glucose-Capillary: 168 mg/dL — ABNORMAL HIGH (ref 70–99)
Glucose-Capillary: 166 mg/dL — ABNORMAL HIGH (ref 70–99)
Glucose-Capillary: 135 mg/dL — ABNORMAL HIGH (ref 70–99)

## 2018-08-26 LAB — TROPONIN I: Troponin I: 0.37 ng/mL (ref <0.03)

## 2018-08-26 LAB — PROTIME-INR
INR: 1.97
Prothrombin Time: 22.2 seconds — ABNORMAL HIGH (ref 11.4–15.2)

## 2018-08-26 LAB — ECHOCARDIOGRAM COMPLETE
Height: 62 in
Weight: 2183.44 oz

## 2018-08-26 LAB — MAGNESIUM: Magnesium: 1.6 mg/dL — ABNORMAL LOW (ref 1.7–2.4)

## 2018-08-26 LAB — APTT: aPTT: 47 seconds — ABNORMAL HIGH (ref 24–36)

## 2018-08-26 MED ORDER — DEXMEDETOMIDINE HCL IN NACL 400 MCG/100ML IV SOLN
0.4000 ug/kg/h | INTRAVENOUS | Status: DC
  Administered 2018-08-26 (×2): 0.6 ug/kg/h via INTRAVENOUS
  Administered 2018-08-27: 0.4 ug/kg/h via INTRAVENOUS
  Filled 2018-08-26 (×2): qty 100

## 2018-08-26 MED ORDER — NOREPINEPHRINE-SODIUM CHLORIDE 4-0.9 MG/250ML-% IV SOLN: 0.0000 ug/min | INTRAVENOUS | Status: DC

## 2018-08-26 MED ORDER — SODIUM CHLORIDE 0.9 % IV SOLN
1.0000 g | INTRAVENOUS | Status: DC
  Administered 2018-08-26 – 2018-08-28 (×3): 1 g via INTRAVENOUS
  Filled 2018-08-26 (×3): qty 1

## 2018-08-26 MED ORDER — SODIUM CHLORIDE 0.9 % IV SOLN: Freq: Once | INTRAVENOUS | Status: DC

## 2018-08-26 MED ORDER — INSULIN ASPART 100 UNIT/ML ~~LOC~~ SOLN
0.0000 [IU] | SUBCUTANEOUS | Status: DC
  Administered 2018-08-26: 1 [IU] via SUBCUTANEOUS
  Administered 2018-08-26 – 2018-08-27 (×5): 2 [IU] via SUBCUTANEOUS
  Administered 2018-08-27 (×2): 1 [IU] via SUBCUTANEOUS
  Administered 2018-08-27 (×2): 2 [IU] via SUBCUTANEOUS
  Administered 2018-08-28: 5 [IU] via SUBCUTANEOUS
  Administered 2018-08-28 (×2): 2 [IU] via SUBCUTANEOUS
  Administered 2018-08-28: 1 [IU] via SUBCUTANEOUS
  Filled 2018-08-26 (×13): qty 1

## 2018-08-26 MED ORDER — SODIUM CHLORIDE 0.9 % IV SOLN
1.0000 g | INTRAVENOUS | Status: DC
  Filled 2018-08-26: qty 1

## 2018-08-26 MED ORDER — HYDROCORTISONE NA SUCCINATE PF 100 MG IJ SOLR
50.0000 mg | Freq: Four times a day (QID) | INTRAMUSCULAR | Status: DC
  Administered 2018-08-26 – 2018-08-27 (×6): 50 mg via INTRAVENOUS
  Filled 2018-08-26 (×6): qty 2

## 2018-08-26 MED ORDER — MAGNESIUM SULFATE 2 GM/50ML IV SOLN
2.0000 g | Freq: Once | INTRAVENOUS | Status: AC
  Administered 2018-08-26: 2 g via INTRAVENOUS
  Filled 2018-08-26: qty 50

## 2018-08-26 MED ORDER — VANCOMYCIN HCL IN DEXTROSE 750-5 MG/150ML-% IV SOLN: 750.0000 mg | INTRAVENOUS | Status: DC

## 2018-08-26 MED ORDER — PANTOPRAZOLE SODIUM 40 MG IV SOLR
40.0000 mg | Freq: Two times a day (BID) | INTRAVENOUS | Status: DC
  Administered 2018-08-26 – 2018-08-28 (×6): 40 mg via INTRAVENOUS
  Filled 2018-08-26 (×6): qty 40

## 2018-08-26 MED ORDER — AMIODARONE IV BOLUS ONLY 150 MG/100ML: 150.0000 mg | Freq: Once | INTRAVENOUS | Status: DC

## 2018-08-26 MED FILL — Medication: Qty: 1 | Status: AC

## 2018-08-26 NOTE — Progress Notes (Signed)
Name: Laura [name] MRN: [id number] DOB: 1928/05/14    ADMISSION DATE:  09/16/2018  BRIEF PATIENT DESCRIPTION: 85 yo female with CKD stage 3 admitted 01/9 following recurrent cardiac arrest, acute hypoxic hypercapnic respiratory failure secondary pneumonia, acute on chronic renal failure with hyperkalemia, septic shock, and cardiogenic shock requiring mechanical intubation and pressors   SIGNIFICANT EVENTS/STUDIES:  01/9-Pt admitted to ICU mechanically intubated post cardiac arrest   REVIEW OF SYSTEMS:   Unable to assess pt intubated   SUBJECTIVE:  Unable to assess pt intubated  VITAL SIGNS: Temp:  [92.5 F (33.6 C)-99 F (37.2 C)] 99 F (37.2 C) (01/10 0900) Pulse Rate:  [40-121] 73 (01/10 0900) Resp:  [6-26] 24 (01/10 0900) BP: (78-191)/(35-116) 134/41 (01/10 0900) SpO2:  [65 %-100 %] 96 % (01/10 0900) FiO2 (%):  [30 %-100 %] 30 % (01/10 0743) Weight:  [60.8 kg-61.9 kg] 61.9 kg (01/10 0439)  PHYSICAL EXAMINATION: General: acutely ill appearing female, NAD mechanically intubated Neuro: sedated, withdraws from painful stimulation, PERRL HEENT: 1+ tongue edema with dried blood , no JVD  Cardiovascular: sinus brady, no R/G Lungs: clear throughout, even, non labored  Abdomen: +BS x4, soft, obese, non distended  Musculoskeletal: normal bulk and tone, no edema Skin: intact no rashes or lesions present   Recent Labs  Lab 08/23/2018 1311  08/28/2018 1351 09/03/2018 1619 08/18/2018 1626 08/26/18 0036  NA 141   < > 136 135  --  137  K 6.2*   < > 3.8 3.8  --  3.8  CL 102   < > 104 105  --  104  CO2 19*  --   --  17*  --  21*  BUN 29*   < > 24* 31*  --  32*  CREATININE 2.03*   < > 1.80* 1.88* 1.89* 1.85*  GLUCOSE 286*   < > 373* 268*  --  230*   < > = values in this interval not displayed.   Recent Labs  Lab 08/22/2018 1450 08/31/2018 1621 08/26/18 0036  HGB 8.2* 8.7* 8.8*  HCT 26.8* 28.6* 28.0*  WBC 12.0* 14.0* 14.1*  PLT 158 171 177   Ct Head Wo  Contrast  Result Date: 09/15/2018 CLINICAL DATA:  Patient presented to the emergency department for cardiac arrest. Given intravenous contrast earlier today. She also complained of difficulty breathing earlier today and went to the urgent care with shortness of breath and cough. Patient had a syncopal event while at the lap, subsequently found to have no pulse. CPR begun immediately and EMS called. EXAM: CT HEAD WITHOUT CONTRAST TECHNIQUE: Contiguous axial images were obtained from the base of the skull through the vertex without intravenous contrast. COMPARISON:  None. FINDINGS: Brain: No evidence of acute infarction, hemorrhage, hydrocephalus, extra-axial collection or mass lesion/mass effect. There is mild ventricular and sulcal enlargement reflecting age-appropriate volume loss. There are mild areas of white matter hypoattenuation consistent with chronic microvascular ischemic change. Vascular: No hyperdense vessel or unexpected calcification. Skull: Normal. Negative for fracture or focal lesion. Sinuses/Orbits: Visualized globes and orbits are unremarkable. The visualized sinuses and mastoid air cells are clear. Other: None. IMPRESSION: 1. No acute intracranial abnormalities. 2. Age-appropriate volume loss and mild chronic microvascular ischemic change. Electronically Signed   By: Lajean Manes M.D.   On: 08/28/2018 15:54   Ct Angio Chest Pe W And/or Wo Contrast  Result Date: 09/04/2018 CLINICAL DATA:  C/o SOB and cough, followed by cardiac arrest EXAM: CT ANGIOGRAPHY CHEST WITH CONTRAST TECHNIQUE: Multidetector  CT imaging of the chest was performed using the standard protocol during bolus administration of intravenous contrast. Multiplanar CT image reconstructions and MIPs were obtained to evaluate the vascular anatomy. CONTRAST:  27m OMNIPAQUE IOHEXOL 350 MG/ML SOLN COMPARISON:  Current chest radiograph. FINDINGS: Cardiovascular: There is satisfactory opacification of the pulmonary arteries to the  segmental level. There is no evidence of a pulmonary embolism. Heart is mildly enlarged. There are three-vessel coronary artery calcifications. No pericardial effusion. Great vessels are normal in caliber. No aortic dissection minimal aortic atherosclerosis. Mediastinum/Nodes: No neck base, axillary, mediastinal or hilar masses or pathologically enlarged lymph nodes. Trachea is patent. Endotracheal tube tip projects 13 mm above the Carina. Nasal/orogastric tube passes below the diaphragm well into the stomach. Lungs/Pleura: Small, right greater than left, pleural effusions. There is dependent airspace opacity in both lungs, right greater than left. At least a component of this is presumably atelectasis. There is additional patchy peribronchovascular type airspace consolidation, both confluent and ground-glass, and both lungs, greater on the right, as well as bilateral interstitial thickening that is most evident in the upper lobes. No pneumothorax. Upper Abdomen: No acute abnormality. Musculoskeletal: No fracture or acute finding. No osteoblastic or osteolytic lesions. Review of the MIP images confirms the above findings. IMPRESSION: 1. No evidence of a pulmonary embolism. 2. Dependent bilateral lung consolidation/atelectasis. Atelectasis is presumably a component of the dependent opacity, but there are findings also consistent with pneumonia, particularly on the right. In addition, there are patchy areas of confluent and ground-glass opacities as well as interstitial thickening and small bilateral pleural effusions that are consistent with pulmonary edema. 3. Mild cardiomegaly and three-vessel coronary artery calcifications. Minor aortic atherosclerosis. 4. Support apparatus is well positioned. Aortic Atherosclerosis (ICD10-I70.0). Electronically Signed   By: DLajean ManesM.D.   On: 09/07/2018 14:39   Dg Chest Portable 1 View  Result Date: 09/07/2018 CLINICAL DATA:  Intubation. EXAM: PORTABLE CHEST 1 VIEW  COMPARISON:  Chest x-ray 10/22/2012. FINDINGS: Endotracheal tube tip noted at the level of the carina, proximal repositioning of approximately 4 cm suggested. NG tube noted with tip below left hemidiaphragm. Cardiomegaly. Diffuse bilateral pulmonary infiltrates/edema. Cardiac B-lines noted. Findings most consistent with congestive heart failure with bilateral pulmonary edema. IMPRESSION: 1. Endotracheal tube tip noted just above the carina. Proximal repositioning of approximately 4 cm suggested. NG tube noted with tip below left hemidiaphragm. 2. Findings consistent with congestive heart failure with bilateral pulmonary edema. Critical Value/emergent results were called by telephone at the time of interpretation on 08/23/2018 at 1:48 pm to Dr. VFransisco Hertz who verbally acknowledged these results. Electronically Signed   By: TMarcello Moores Register   On: 08/17/2018 13:52    ASSESSMENT / PLAN:  Acute hypoxic hypercapnic respiratory failure secondary to pneumonia vs. URI Mechanical intubation Full vent support for now-vent settings reviewed and established SBT once all parameters met VAP bundle implemented  Prn bronchodilator therapy  Recurrent cardiac arrest (PEA/Ventricular Tachycardia) Cardiogenic shock  Elevated troponin secondary to demand ischemia vs. NSTEMI Hx: HTN Continuous telemetry monitoring Trend troponin's Cardiology consulted appreciate input Hold outpatient antihypertensives  Acute on chronic renal failure with hyperkalemia  Trend BMP  Replace electrolytes as indicated Monitor UOP Avoid nephrotoxic medications   Transaminitis secondary to septic and cardiogenic shock  Trend hepatic panel  Anemia with acute blood loss s/p TPA treated empirically for possible pulmonary embolism (CT negative for PE 08/25/18) Trend CBC Monitor for s/sx of bleeding and transfuse for hgb <7 Repeat coags pending if elevated will give FFP  and vitamin K  Type II Diabetes Mellitus CBG's q4hrs SSI    Acute encephalopathy concerning for possible anoxic injury secondary to recurrent cardiac arrest  Mechanical intubation discomfort/pain Maintain RASS goal 0 to -1 Precedex gtt to maintain RASS goal, wean Fentanyl gtt off  WUA daily  Will repeat CT Head in 24 hrs   Marda Stalker, [place] Pager [phone] (please enter 7 digits) PCCM Consult Pager [phone] (please enter 7 digits)

## 2018-08-26 NOTE — Progress Notes (Signed)
*  PRELIMINARY RESULTS* Echocardiogram 2D Echocardiogram has been performed.  Joanette Gula Devon Pretty 08/26/2018, 1:42 PM

## 2018-08-26 NOTE — Progress Notes (Addendum)
Patient Name: Laura [name] Date of Encounter: 08/26/2018  Hospital Problem List     Active Problems:   Cardiac arrest [facility])    Patient Profile     ED 85 year old female with no prior cardiac history status post acute cardiac arrest.  Currently intubated and sedated.  Patient family member states she was in her usual state of health very functional until this event occurred.  Patient underwent CPR on multiple occasions.  Received TPA upon arrival at the hospital for possible pulmonary embolus.  Troponin mildly elevated peaked at 0.5.  Is of note patient received CPR on several occasions.  Mild renal insufficiency.  Electrocardiogram showed sinus bradycardia with left bundle branch block.Now with markedly prolonged inr secondary to tpa.  Subjective   Intubated and sedated  Inpatient Medications    . chlorhexidine gluconate (MEDLINE KIT)  15 mL Mouth Rinse BID  . fentaNYL (SUBLIMAZE) injection  50 mcg Intravenous Once  . hydrocortisone sod succinate (SOLU-CORTEF) inj  50 mg Intravenous Q6H  . insulin aspart  0-9 Units Subcutaneous Q4H  . mouth rinse  15 mL Mouth Rinse 10 times per day  . pantoprazole (PROTONIX) IV  40 mg Intravenous Q12H    Vital Signs    Vitals:   08/26/18 0615 08/26/18 0630 08/26/18 0800 08/26/18 0900  BP: (!) 136/39 (!) 135/36 (!) 143/39 (!) 134/41  Pulse: (!) 57 (!) 57 (!) 57 73  Resp: (!) 9 16 (!) 6 (!) 24  Temp: 98.8 F (37.1 C) 98.8 F (37.1 C) 99 F (37.2 C) 99 F (37.2 C)  TempSrc:      SpO2: 95% 95% 97% 96%  Weight:      Height:        Intake/Output Summary (Last 24 hours) at 08/26/2018 1204 Last data filed at 08/26/2018 0600 Gross per 24 hour  Intake 2841.46 ml  Output 3350 ml  Net -508.54 ml   Filed Weights   09/04/2018 1307 08/26/18 0439  Weight: 60.8 kg 61.9 kg    Physical Exam    GEN: Well nourished, well developed, in no acute distress.  HEENT: normal.  Neck: Supple, no JVD, carotid bruits, or masses. Cardiac: RRR,  no murmurs. Respiratory: Breathing with ventilator. GI: Soft, nontender, nondistended, BS + x 4. MS: no deformity or atrophy. Skin: warm and dry, no rash. Neuro: Intubated and sedated with some purposeful movement.  Labs    CBC Recent Labs    09/11/2018 1311  09/11/2018 1621 08/26/18 0036  WBC 11.1*   < > 14.0* 14.1*  NEUTROABS 3.8  --   --   --   HGB 8.7*   < > 8.7* 8.8*  HCT 29.4*   < > 28.6* 28.0*  MCV 94.5   < > 92.3 90.6  PLT 172   < > 171 177   < > = values in this interval not displayed.   Basic Metabolic Panel Recent Labs    09/08/2018 1619 09/06/2018 1626 08/26/18 0036  NA 135  --  137  K 3.8  --  3.8  CL 105  --  104  CO2 17*  --  21*  GLUCOSE 268*  --  230*  BUN 31*  --  32*  CREATININE 1.88* 1.89* 1.85*  CALCIUM 9.4  --  9.2  MG  --   --  1.6*   Liver Function Tests Recent Labs    08/22/2018 1311 08/26/18 0036  AST 1,308* 3,875*  ALT 1,211* 2,263*  ALKPHOS 50 60  BILITOT 0.6 0.9  PROT 6.2* 5.8*  ALBUMIN 2.8* 2.7*   No results for input(s): LIPASE, AMYLASE in the last 72 hours. Cardiac Enzymes Recent Labs    09/13/2018 1626 09/03/2018 2041 08/26/18 0457  TROPONINI 0.30* 0.50* 0.37*   BNP No results for input(s): BNP in the last 72 hours. D-Dimer No results for input(s): DDIMER in the last 72 hours. Hemoglobin A1C No results for input(s): HGBA1C in the last 72 hours. Fasting Lipid Panel Recent Labs    08/18/2018 1129 09/03/2018 2219  CHOL 170  --   HDL 79  --   LDLCALC 79  --   TRIG 61 72  CHOLHDL 2.2  --    Thyroid Function Tests No results for input(s): TSH, T4TOTAL, T3FREE, THYROIDAB in the last 72 hours.  Invalid input(s): FREET3  Telemetry    Sinus bradycardia  ECG    Sinus bradycardia with left bundle branch block.  Radiology    Ct Head Wo Contrast  Result Date: 09/11/2018 CLINICAL DATA:  Patient presented to the emergency department for cardiac arrest. Given intravenous contrast earlier today. She also complained of  difficulty breathing earlier today and went to the urgent care with shortness of breath and cough. Patient had a syncopal event while at the lap, subsequently found to have no pulse. CPR begun immediately and EMS called. EXAM: CT HEAD WITHOUT CONTRAST TECHNIQUE: Contiguous axial images were obtained from the base of the skull through the vertex without intravenous contrast. COMPARISON:  None. FINDINGS: Brain: No evidence of acute infarction, hemorrhage, hydrocephalus, extra-axial collection or mass lesion/mass effect. There is mild ventricular and sulcal enlargement reflecting age-appropriate volume loss. There are mild areas of white matter hypoattenuation consistent with chronic microvascular ischemic change. Vascular: No hyperdense vessel or unexpected calcification. Skull: Normal. Negative for fracture or focal lesion. Sinuses/Orbits: Visualized globes and orbits are unremarkable. The visualized sinuses and mastoid air cells are clear. Other: None. IMPRESSION: 1. No acute intracranial abnormalities. 2. Age-appropriate volume loss and mild chronic microvascular ischemic change. Electronically Signed   By: Lajean Manes M.D.   On: 08/31/2018 15:54   Ct Angio Chest Pe W And/or Wo Contrast  Result Date: 08/21/2018 CLINICAL DATA:  C/o SOB and cough, followed by cardiac arrest EXAM: CT ANGIOGRAPHY CHEST WITH CONTRAST TECHNIQUE: Multidetector CT imaging of the chest was performed using the standard protocol during bolus administration of intravenous contrast. Multiplanar CT image reconstructions and MIPs were obtained to evaluate the vascular anatomy. CONTRAST:  48m OMNIPAQUE IOHEXOL 350 MG/ML SOLN COMPARISON:  Current chest radiograph. FINDINGS: Cardiovascular: There is satisfactory opacification of the pulmonary arteries to the segmental level. There is no evidence of a pulmonary embolism. Heart is mildly enlarged. There are three-vessel coronary artery calcifications. No pericardial effusion. Great vessels are  normal in caliber. No aortic dissection minimal aortic atherosclerosis. Mediastinum/Nodes: No neck base, axillary, mediastinal or hilar masses or pathologically enlarged lymph nodes. Trachea is patent. Endotracheal tube tip projects 13 mm above the Carina. Nasal/orogastric tube passes below the diaphragm well into the stomach. Lungs/Pleura: Small, right greater than left, pleural effusions. There is dependent airspace opacity in both lungs, right greater than left. At least a component of this is presumably atelectasis. There is additional patchy peribronchovascular type airspace consolidation, both confluent and ground-glass, and both lungs, greater on the right, as well as bilateral interstitial thickening that is most evident in the upper lobes. No pneumothorax. Upper Abdomen: No acute abnormality. Musculoskeletal: No fracture or acute finding. No osteoblastic or osteolytic lesions.  Review of the MIP images confirms the above findings. IMPRESSION: 1. No evidence of a pulmonary embolism. 2. Dependent bilateral lung consolidation/atelectasis. Atelectasis is presumably a component of the dependent opacity, but there are findings also consistent with pneumonia, particularly on the right. In addition, there are patchy areas of confluent and ground-glass opacities as well as interstitial thickening and small bilateral pleural effusions that are consistent with pulmonary edema. 3. Mild cardiomegaly and three-vessel coronary artery calcifications. Minor aortic atherosclerosis. 4. Support apparatus is well positioned. Aortic Atherosclerosis (ICD10-I70.0). Electronically Signed   By: Lajean Manes M.D.   On: 08/22/2018 14:39   Dg Chest Port 1 View  Result Date: 08/26/2018 CLINICAL DATA:  Acute respiratory failure. EXAM: PORTABLE CHEST 1 VIEW COMPARISON:  Chest radiograph and CTA 09/03/2018 FINDINGS: Endotracheal tube appears to have been withdrawn since the prior radiograph and now terminates 3 cm above the carina.  Enteric tube courses into the left upper abdomen with tip not imaged. Airspace opacities on the prior radiograph have substantially improved. Residual opacities are most notable in the right lower lobe and left upper lobe. There are likely persistent small bilateral pleural effusions. No pneumothorax is identified. IMPRESSION: Interval improvement of bilateral airspace disease. Electronically Signed   By: Logan Bores M.D.   On: 08/26/2018 11:38   Dg Chest Portable 1 View  Result Date: 08/17/2018 CLINICAL DATA:  Intubation. EXAM: PORTABLE CHEST 1 VIEW COMPARISON:  Chest x-ray 10/22/2012. FINDINGS: Endotracheal tube tip noted at the level of the carina, proximal repositioning of approximately 4 cm suggested. NG tube noted with tip below left hemidiaphragm. Cardiomegaly. Diffuse bilateral pulmonary infiltrates/edema. Cardiac B-lines noted. Findings most consistent with congestive heart failure with bilateral pulmonary edema. IMPRESSION: 1. Endotracheal tube tip noted just above the carina. Proximal repositioning of approximately 4 cm suggested. NG tube noted with tip below left hemidiaphragm. 2. Findings consistent with congestive heart failure with bilateral pulmonary edema. Critical Value/emergent results were called by telephone at the time of interpretation on 09/03/2018 at 1:48 pm to Dr. Fransisco Hertz, who verbally acknowledged these results. Electronically Signed   By: Marcello Moores  Register   On: 09/03/2018 13:52    Assessment & Plan    85 year old female status post arrest.  Patient was apparently in her usual state of health and was visiting her primary care physician's office.  She then developed acute onset of shortness of breath followed by seizure-like activity.  EMS arrived noted no pulse.  Underwent CPR on several occasions after being noted to have PEA.  Chest x-ray showed mild pulmonary edema.  Mild troponin elevation at 0.05.  Electrocardiogram showed sinus rhythm with left bundle branch block.  Bundle  branch block is not new.  Was present in May 2019.  Abnormal QRS in 2014.  We will continue to attempt to wean vent following neurologic function, renal function and rhythm.  After patient extubate as renal function improves, consider further ischemic work-up.  Echocardiogram is pending.  Signed, Javier Docker Douglas Smolinsky MD 08/26/2018, 12:04 PM  Pager: [phone]

## 2018-08-26 NOTE — Progress Notes (Signed)
Pt having arrhthymias on telemetry. Electrolytes within normal limits.  EKG obtained which shows new LBBB, no ischemic changes.  Troponin is 0.37 (previous troponin 0.3 & 0.5).  Pt remains on Levophed drip (currenlty at 10 mcg) to maintain MAP 65.  Called and discussed new LBBB with Dr. Juliann Pares.  Pt unable to be placed on heparin drip as she received tPA yesterday evening.  Unable to give amiodarone as pt is currently bradycardic.  Per Dr. Juliann Pares, no additional orders or recommendations at this time.   Harlon Ditty, AGACNP-BC [facility] Pager: [phone] Cell: [phone]

## 2018-08-26 NOTE — Progress Notes (Signed)
Pharmacy Antibiotic Note  Laura [name] is a 85 y.o. female admitted on 09-17-2018 with pneumonia.  Pharmacy has been consulted for cefepime and vancomycin dosing.  Plan: Will order cefepime 1 g q24H   Will order a vancomycin 1500 mg x1 loading dose followed by vancomycin 750 mg q48H maintenance dose. Predicted AUC 461. Goal AUC 400-550. Obtain levels at 4th or 5th day.   Vd 43.8, Ke 0.019, t1/2 37.3. Scr 1.8 used for calculations.   Abx had been d/c. Original orders reinstated. mm  Height: 5\' 2"  (157.5 cm) Weight: 136 lb 7.4 oz (61.9 kg) IBW/kg (Calculated) : 50.1  Temp (24hrs), Avg:95.8 F (35.4 C), Min:92.5 F (33.6 C), Max:98.2 F (36.8 C)  Recent Labs  Lab 09-17-2018 1311 09/17/2018 1314 09/17/2018 1351 17-Sep-2018 1450 09/17/18 1619 September 17, 2018 1621 09-17-2018 1626 08/26/18 0036  WBC 11.1*  --   --  12.0*  --  14.0*  --  14.1*  CREATININE 2.03* 1.80* 1.80*  --  1.88*  --  1.89* 1.85*    Estimated Creatinine Clearance: 18.2 mL/min (A) (by C-G formula based on SCr of 1.85 mg/dL (H)).    Allergies  Allergen Reactions  . Angiotensin Receptor Blockers     Antimicrobials this admission: 1/9 cefepime >>  1/9 vancomycin >>  1/9 azithro + ceftriaxone x 1   Dose adjustments this admission: Cefepime was adjusted per renal function   Microbiology results: None  Thank you for allowing pharmacy to be a part of this patient's care.  Erich Montane, PharmD, BCPS Clinical Pharmacist 08/26/2018 5:24 AM

## 2018-08-26 NOTE — Progress Notes (Signed)
Palliative Note:   Referral received for goals of care. At this time family remains hopeful and wanting all aggressive measures performed.   Family will require continuous support, however, they request and will need time to process patient's illness and condition before accepting to discuss or meet with Palliative. We will allow time for medical interventions and any further work-up and attempt to engage with family on Monday if she remains hospitalized.   Thank you for your referral.   Willette Alma, AGPCNP-BC Palliative Medicine Team  Pager: [phone] Amion: N. [place]   NO CHARGE

## 2018-08-26 NOTE — Progress Notes (Signed)
Patient ID: Laura [name], female   DOB: 1928-08-08, 85 y.o.   MRN: [id number]  [facility] PROGRESS NOTE  Almeter Westhoff QMG:[id number] DOB: [birth year] DOA: 08/26/2018 PCP: Juline Patch, MD  HPI/Subjective: Patient had an arrest yesterday.  Currently on the ventilator.  Received TPA by ER physician.  Family states that when she was tapered on the sedation she was able to follow some commands.  Family concerned about facial swelling  Objective: Vitals:   08/26/18 1100 08/26/18 1200  BP: (!) 104/41 (!) 104/46  Pulse: (!) 56 (!) 56  Resp: 16 16  Temp: 99.1 F (37.3 C) 99.1 F (37.3 C)  SpO2: 99% 99%    Filed Weights   09/15/2018 1307 08/26/18 0439  Weight: 60.8 kg 61.9 kg    ROS: Review of Systems  Unable to perform ROS: Acuity of condition   Exam: Physical Exam  HENT:  Nose: No mucosal edema.  Some tongue swelling and blood around the tongue.  Eyes: Pupils are equal, round, and reactive to light. Conjunctivae and lids are normal.  Neck: No JVD present. Carotid bruit is not present. No edema present. No thyroid mass and no thyromegaly present.  Cardiovascular: S1 normal and S2 normal. Exam reveals no gallop.  No murmur heard. Pulses:      Dorsalis pedis pulses are 2+ on the right side and 2+ on the left side.  Respiratory: No respiratory distress. She has decreased breath sounds in the right lower field and the left lower field. She has no wheezes. She has no rhonchi. She has no rales.  GI: Soft. Bowel sounds are normal. There is no abdominal tenderness.  Musculoskeletal:     Right ankle: She exhibits no swelling.     Left ankle: She exhibits no swelling.  Lymphadenopathy:    She has no cervical adenopathy.  Neurological:  Sedated on ventilator.  Skin: Skin is warm. No rash noted. Nails show no clubbing.  Psychiatric:  Sedated on ventilator      Data Reviewed: Basic Metabolic Panel: Recent Labs  Lab 09/01/2018 1311 09/13/2018 1314  08/27/2018 1351 09/04/2018 1619 08/17/2018 1626 08/26/18 0036  NA 141 137 136 135  --  137  K 6.2* 6.0* 3.8 3.8  --  3.8  CL 102 105 104 105  --  104  CO2 19*  --   --  17*  --  21*  GLUCOSE 286* 272* 373* 268*  --  230*  BUN 29* 26* 24* 31*  --  32*  CREATININE 2.03* 1.80* 1.80* 1.88* 1.89* 1.85*  CALCIUM 14.8*  --   --  9.4  --  9.2  MG  --   --   --   --   --  1.6*   Liver Function Tests: Recent Labs  Lab 09/15/2018 1311 08/26/18 0036  AST 1,308* 3,875*  ALT 1,211* 2,263*  ALKPHOS 50 60  BILITOT 0.6 0.9  PROT 6.2* 5.8*  ALBUMIN 2.8* 2.7*   CBC: Recent Labs  Lab 09/07/2018 1311 09/11/2018 1314 08/19/2018 1351 08/20/2018 1450 09/05/2018 1621 08/26/18 0036  WBC 11.1*  --   --  12.0* 14.0* 14.1*  NEUTROABS 3.8  --   --   --   --   --   HGB 8.7* 9.2* 7.1* 8.2* 8.7* 8.8*  HCT 29.4* 27.0* 21.0* 26.8* 28.6* 28.0*  MCV 94.5  --   --  92.4 92.3 90.6  PLT 172  --   --  158 171 177   Cardiac  Enzymes: Recent Labs  Lab 09/10/2018 1311 08/23/2018 1626 09/13/2018 2041 08/26/18 0457  TROPONINI 0.04* 0.30* 0.50* 0.37*    CBG: Recent Labs  Lab 09/08/2018 2005 08/26/18 0124 08/26/18 0340 08/26/18 0719 08/26/18 1125  GLUCAP 233* 188* 173* 161* 196*    Recent Results (from the past 240 hour(s))  MRSA PCR Screening     Status: None   Collection Time: 08/18/2018  5:11 PM  Result Value Ref Range Status   MRSA by PCR NEGATIVE NEGATIVE Final    Comment:        The GeneXpert MRSA Assay (FDA approved for NASAL specimens only), is one component of a comprehensive MRSA colonization surveillance program. It is not intended to diagnose MRSA infection nor to guide or monitor treatment for MRSA infections. Performed at [facility], [address] [place], [place] [address]      Studies: Ct Head Wo Contrast  Result Date: 09/05/2018 CLINICAL DATA:  Patient presented to the emergency department for cardiac arrest. Given intravenous contrast earlier today. She also complained of  difficulty breathing earlier today and went to the urgent care with shortness of breath and cough. Patient had a syncopal event while at the lap, subsequently found to have no pulse. CPR begun immediately and EMS called. EXAM: CT HEAD WITHOUT CONTRAST TECHNIQUE: Contiguous axial images were obtained from the base of the skull through the vertex without intravenous contrast. COMPARISON:  None. FINDINGS: Brain: No evidence of acute infarction, hemorrhage, hydrocephalus, extra-axial collection or mass lesion/mass effect. There is mild ventricular and sulcal enlargement reflecting age-appropriate volume loss. There are mild areas of white matter hypoattenuation consistent with chronic microvascular ischemic change. Vascular: No hyperdense vessel or unexpected calcification. Skull: Normal. Negative for fracture or focal lesion. Sinuses/Orbits: Visualized globes and orbits are unremarkable. The visualized sinuses and mastoid air cells are clear. Other: None. IMPRESSION: 1. No acute intracranial abnormalities. 2. Age-appropriate volume loss and mild chronic microvascular ischemic change. Electronically Signed   By: Lajean Manes M.D.   On: 08/24/2018 15:54   Ct Angio Chest Pe W And/or Wo Contrast  Result Date: 08/24/2018 CLINICAL DATA:  C/o SOB and cough, followed by cardiac arrest EXAM: CT ANGIOGRAPHY CHEST WITH CONTRAST TECHNIQUE: Multidetector CT imaging of the chest was performed using the standard protocol during bolus administration of intravenous contrast. Multiplanar CT image reconstructions and MIPs were obtained to evaluate the vascular anatomy. CONTRAST:  31m OMNIPAQUE IOHEXOL 350 MG/ML SOLN COMPARISON:  Current chest radiograph. FINDINGS: Cardiovascular: There is satisfactory opacification of the pulmonary arteries to the segmental level. There is no evidence of a pulmonary embolism. Heart is mildly enlarged. There are three-vessel coronary artery calcifications. No pericardial effusion. Great vessels are  normal in caliber. No aortic dissection minimal aortic atherosclerosis. Mediastinum/Nodes: No neck base, axillary, mediastinal or hilar masses or pathologically enlarged lymph nodes. Trachea is patent. Endotracheal tube tip projects 13 mm above the Carina. Nasal/orogastric tube passes below the diaphragm well into the stomach. Lungs/Pleura: Small, right greater than left, pleural effusions. There is dependent airspace opacity in both lungs, right greater than left. At least a component of this is presumably atelectasis. There is additional patchy peribronchovascular type airspace consolidation, both confluent and ground-glass, and both lungs, greater on the right, as well as bilateral interstitial thickening that is most evident in the upper lobes. No pneumothorax. Upper Abdomen: No acute abnormality. Musculoskeletal: No fracture or acute finding. No osteoblastic or osteolytic lesions. Review of the MIP images confirms the above findings. IMPRESSION: 1. No  evidence of a pulmonary embolism. 2. Dependent bilateral lung consolidation/atelectasis. Atelectasis is presumably a component of the dependent opacity, but there are findings also consistent with pneumonia, particularly on the right. In addition, there are patchy areas of confluent and ground-glass opacities as well as interstitial thickening and small bilateral pleural effusions that are consistent with pulmonary edema. 3. Mild cardiomegaly and three-vessel coronary artery calcifications. Minor aortic atherosclerosis. 4. Support apparatus is well positioned. Aortic Atherosclerosis (ICD10-I70.0). Electronically Signed   By: Lajean Manes M.D.   On: 08/18/2018 14:39   Dg Chest Port 1 View  Result Date: 08/26/2018 CLINICAL DATA:  Acute respiratory failure. EXAM: PORTABLE CHEST 1 VIEW COMPARISON:  Chest radiograph and CTA 08/30/2018 FINDINGS: Endotracheal tube appears to have been withdrawn since the prior radiograph and now terminates 3 cm above the carina.  Enteric tube courses into the left upper abdomen with tip not imaged. Airspace opacities on the prior radiograph have substantially improved. Residual opacities are most notable in the right lower lobe and left upper lobe. There are likely persistent small bilateral pleural effusions. No pneumothorax is identified. IMPRESSION: Interval improvement of bilateral airspace disease. Electronically Signed   By: Logan Bores M.D.   On: 08/26/2018 11:38   Dg Chest Portable 1 View  Result Date: 08/21/2018 CLINICAL DATA:  Intubation. EXAM: PORTABLE CHEST 1 VIEW COMPARISON:  Chest x-ray 10/22/2012. FINDINGS: Endotracheal tube tip noted at the level of the carina, proximal repositioning of approximately 4 cm suggested. NG tube noted with tip below left hemidiaphragm. Cardiomegaly. Diffuse bilateral pulmonary infiltrates/edema. Cardiac B-lines noted. Findings most consistent with congestive heart failure with bilateral pulmonary edema. IMPRESSION: 1. Endotracheal tube tip noted just above the carina. Proximal repositioning of approximately 4 cm suggested. NG tube noted with tip below left hemidiaphragm. 2. Findings consistent with congestive heart failure with bilateral pulmonary edema. Critical Value/emergent results were called by telephone at the time of interpretation on 09/11/2018 at 1:48 pm to Dr. Fransisco Hertz, who verbally acknowledged these results. Electronically Signed   By: Marcello Moores  Register   On: 08/22/2018 13:52    Scheduled Meds: . chlorhexidine gluconate (MEDLINE KIT)  15 mL Mouth Rinse BID  . fentaNYL (SUBLIMAZE) injection  50 mcg Intravenous Once  . hydrocortisone sod succinate (SOLU-CORTEF) inj  50 mg Intravenous Q6H  . insulin aspart  0-9 Units Subcutaneous Q4H  . mouth rinse  15 mL Mouth Rinse 10 times per day  . pantoprazole (PROTONIX) IV  40 mg Intravenous Q12H   Continuous Infusions: . sodium chloride 100 mL/hr at 08/26/18 0600  . azithromycin    . cefTRIAXone (ROCEPHIN)  IV    . dexmedetomidine  (PRECEDEX) IV infusion 0.6 mcg/kg/hr (08/26/18 1044)  . DOPamine Stopped (08/24/2018 1600)  . fentaNYL infusion INTRAVENOUS 225 mcg/hr (08/26/18 0600)  . norepinephrine (LEVOPHED) Adult infusion Stopped (08/26/18 1228)  . propofol (DIPRIVAN) infusion 25 mcg/kg/min (08/26/18 0600)    Assessment/Plan:  1. Cardiac arrest.  Supportive care.  Currently on the ventilator.  As per family when her sedation was tapered down she was following commands.  Pulmonary following to manage ventilator.  Patient received TPA by ER physician likely the cause of the bleeding. 2. Pneumonia on CT scan.  On Rocephin and Zithromax.  Patient on stress dose steroids 3. Shock liver.  Continue to monitor liver function test 4. Acute kidney injury on chronic kidney disease.  Continue to monitor kidney function 5. Anemia with bleeding.  Continue to watch hemoglobin.  Code Status:     Code Status  Orders  (From admission, onward)         Start     Ordered   09/09/2018 1626  Full code  Continuous     09/05/2018 1625        Code Status History    This patient has a current code status but no historical code status.     Family Communication: Family at bedside Disposition Plan: To be determined  Consultants:  Critical care specialist  Cardiology  Antibiotics:  Rocephin  Zithromax  Time spent: 25 minutes  [place]

## 2018-08-26 NOTE — Progress Notes (Signed)
Initial Nutrition Assessment  DOCUMENTATION CODES:   Not applicable  INTERVENTION:   If tube feeds initiated recommend vital HP @40ml /hr  Free water flushes 71ml q 4 hours   Propofol: 9.12 ml/hr- provides 241kcal/day   Regimen with propofol provides 1201kcal/day, 84g/day protein, 918ml/day free water   Provide liquid MVI daily via tube     NUTRITION DIAGNOSIS:   Inadequate oral intake related to acute illness(pt sedated and ventilated ) as evidenced by NPO status  GOAL:   Provide needs based on ASPEN/SCCM guidelines  MONITOR:   Vent status, Labs, Weight trends, Skin, I & O's  REASON FOR ASSESSMENT:   Ventilator    ASSESSMENT:   85 yo AAF with h/o DM, CKD III, CHF admitted to ICU for severe resp failure and cardiac arrest most likely from URI viral syndrome leading to acute cardiac arrest with liver failure and cardiogenic/septic shock  PNA noted on CT scan  Pt sedated and ventilated. Family at bedside reports pt with good appetite and oral intake at baseline. Family also reports that pt appears weight stable. Per chart, pt appears to have lost ~3lbs which is not significant. OGT in place to suction with ~751ml output today. Pt continues to have elevated INR; bleeding secondary to TPA. No plans for tube feeds today. Pt to possibly extubate.     Medications reviewed and include: insulin, protonix, NaCl @100ml /hr, azithromycin, ceftriaxone, precedex, fentanyl  Labs reviewed: BUN 32(H), creat 1.85(H), Mg 1.6(L), AST 3875(H), ALT 2263(H) Hgb 8.8(L), Hct 28.0(L) cbgs- 188, 173, 161, 196 x 24 hrs AIC- 7.1(H)- 12/15/2016  Patient is currently intubated on ventilator support MV: 8.7 L/min Temp (24hrs), Avg:96.7 F (35.9 C), Min:92.8 F (33.8 C), Max:99.1 F (37.3 C)  Propofol: 9.12 ml/hr- provides 241kcal/day   MAP- 59-7mmHg  UOP- x 24 hrs  NUTRITION - FOCUSED PHYSICAL EXAM:    Most Recent Value  Orbital Region  No depletion  Upper Arm Region  No  depletion  Thoracic and Lumbar Region  No depletion  Buccal Region  No depletion  Temple Region  No depletion  Clavicle Bone Region  No depletion  Clavicle and Acromion Bone Region  No depletion  Scapular Bone Region  No depletion  Dorsal Hand  No depletion  Patellar Region  No depletion  Anterior Thigh Region  No depletion  Posterior Calf Region  No depletion  Edema (RD Assessment)  Mild  Hair  Reviewed  Eyes  Reviewed  Mouth  Reviewed  Skin  Reviewed  Nails  Reviewed     Diet Order:   Diet Order            Diet NPO time specified  Diet effective now             EDUCATION NEEDS:   No education needs have been identified at this time  Skin:  Skin Assessment: Reviewed RN Assessment  Last BM:  pta  Height:   Ht Readings from Last 1 Encounters:  08/28/2018 5\' 2"  (1.575 m)    Weight:   Wt Readings from Last 1 Encounters:  08/26/18 61.9 kg    Ideal Body Weight:  50 kg  BMI:  Body mass index is 24.96 kg/m.  Estimated Nutritional Needs:   Kcal:  1255kcal/day   Protein:  75-85g/day   Fluid:  >1.3L/day or per MD  Betsey Holiday MS, RD, LDN Pager #- [phone] Office#- [phone] After Hours Pager: [phone]

## 2018-08-27 ENCOUNTER — Inpatient Hospital Stay: Payer: Medicare Other

## 2018-08-27 ENCOUNTER — Inpatient Hospital Stay: Payer: Self-pay

## 2018-08-27 LAB — ABO/RH: ABO/RH(D): O POS

## 2018-08-27 LAB — CBC WITH DIFFERENTIAL/PLATELET
Abs Immature Granulocytes: 0.08 10*3/uL — ABNORMAL HIGH (ref 0.00–0.07)
Basophils Absolute: 0 10*3/uL (ref 0.0–0.1)
Basophils Relative: 0 %
EOS ABS: 0 10*3/uL (ref 0.0–0.5)
EOS PCT: 0 %
HCT: 21.2 % — ABNORMAL LOW (ref 36.0–46.0)
Hemoglobin: 6.7 g/dL — ABNORMAL LOW (ref 12.0–15.0)
Immature Granulocytes: 1 %
Lymphocytes Relative: 9 %
Lymphs Abs: 0.9 10*3/uL (ref 0.7–4.0)
MCH: 28.4 pg (ref 26.0–34.0)
MCHC: 31.6 g/dL (ref 30.0–36.0)
MCV: 89.8 fL (ref 80.0–100.0)
Monocytes Absolute: 0.6 10*3/uL (ref 0.1–1.0)
Monocytes Relative: 6 %
Neutro Abs: 8.7 10*3/uL — ABNORMAL HIGH (ref 1.7–7.7)
Neutrophils Relative %: 84 %
Platelets: 128 10*3/uL — ABNORMAL LOW (ref 150–400)
RBC: 2.36 MIL/uL — ABNORMAL LOW (ref 3.87–5.11)
RDW: 15.5 % (ref 11.5–15.5)
WBC: 10.3 10*3/uL (ref 4.0–10.5)
nRBC: 0.7 % — ABNORMAL HIGH (ref 0.0–0.2)

## 2018-08-27 LAB — GLUCOSE, CAPILLARY
Glucose-Capillary: 148 mg/dL — ABNORMAL HIGH (ref 70–99)
Glucose-Capillary: 125 mg/dL — ABNORMAL HIGH (ref 70–99)
Glucose-Capillary: 186 mg/dL — ABNORMAL HIGH (ref 70–99)
Glucose-Capillary: 162 mg/dL — ABNORMAL HIGH (ref 70–99)
Glucose-Capillary: 166 mg/dL — ABNORMAL HIGH (ref 70–99)
Glucose-Capillary: 161 mg/dL — ABNORMAL HIGH (ref 70–99)

## 2018-08-27 LAB — BASIC METABOLIC PANEL
Anion gap: 11 (ref 5–15)
BUN: 31 mg/dL — ABNORMAL HIGH (ref 8–23)
CALCIUM: 8.4 mg/dL — AB (ref 8.9–10.3)
CO2: 21 mmol/L — ABNORMAL LOW (ref 22–32)
Chloride: 109 mmol/L (ref 98–111)
Creatinine, Ser: 1.93 mg/dL — ABNORMAL HIGH (ref 0.44–1.00)
GFR calc Af Amer: 26 mL/min — ABNORMAL LOW (ref >60)
GFR calc non Af Amer: 23 mL/min — ABNORMAL LOW (ref >60)
Glucose, Bld: 159 mg/dL — ABNORMAL HIGH (ref 70–99)
Potassium: 3.6 mmol/L (ref 3.5–5.1)
Sodium: 141 mmol/L (ref 135–145)

## 2018-08-27 LAB — HEMOGLOBIN AND HEMATOCRIT, BLOOD
HCT: 27.5 % — ABNORMAL LOW (ref 36.0–46.0)
HEMOGLOBIN: 8.5 g/dL — AB (ref 12.0–15.0)

## 2018-08-27 LAB — PROCALCITONIN: PROCALCITONIN: 14.77 ng/mL

## 2018-08-27 MED ORDER — FUROSEMIDE 10 MG/ML IJ SOLN
20.0000 mg | Freq: Once | INTRAMUSCULAR | Status: AC
  Administered 2018-08-27: 20 mg via INTRAVENOUS
  Filled 2018-08-27: qty 2

## 2018-08-27 MED ORDER — SODIUM CHLORIDE 0.9% IV SOLUTION: Freq: Once | INTRAVENOUS | Status: DC

## 2018-08-27 MED ORDER — DEXAMETHASONE SODIUM PHOSPHATE 10 MG/ML IJ SOLN
10.0000 mg | Freq: Once | INTRAMUSCULAR | Status: AC
  Administered 2018-08-27: 10 mg via INTRAVENOUS
  Filled 2018-08-27 (×2): qty 1

## 2018-08-27 MED ORDER — SODIUM CHLORIDE 0.9% IV SOLUTION
Freq: Once | INTRAVENOUS | Status: AC
  Administered 2018-08-27: 10:00:00 via INTRAVENOUS

## 2018-08-27 MED ORDER — FUROSEMIDE 20 MG PO TABS: 20.0000 mg | ORAL_TABLET | Freq: Once | ORAL | Status: DC

## 2018-08-27 MED ORDER — MIDAZOLAM HCL 2 MG/2ML IJ SOLN
2.0000 mg | INTRAMUSCULAR | Status: DC | PRN
  Administered 2018-08-27 – 2018-08-28 (×4): 2 mg via INTRAVENOUS
  Filled 2018-08-27 (×4): qty 2

## 2018-08-27 MED ORDER — MIDAZOLAM HCL 2 MG/2ML IJ SOLN
2.0000 mg | Freq: Once | INTRAMUSCULAR | Status: AC
  Administered 2018-08-27: 2 mg via INTRAVENOUS
  Filled 2018-08-27: qty 2

## 2018-08-27 NOTE — Progress Notes (Signed)
Patient Name: Laura [name] Date of Encounter: 08/27/2018  Hospital Problem List     Active Problems:   Cardiac arrest [facility])    Patient Profile     85 year old female with no prior cardiac history admitted after cardiac arrest.  Patient presented to her acute care locally or minor problems with acute onset of shortness of breath followed by cardiac arrest.  Receive CPR on 3 or 4 occasions for PEA.  Currently intubated and sedated.  Subjective   Somewhat agitated as vent weaning is being undertaken.  Inpatient Medications    . chlorhexidine gluconate (MEDLINE KIT)  15 mL Mouth Rinse BID  . fentaNYL (SUBLIMAZE) injection  50 mcg Intravenous Once  . hydrocortisone sod succinate (SOLU-CORTEF) inj  50 mg Intravenous Q6H  . insulin aspart  0-9 Units Subcutaneous Q4H  . mouth rinse  15 mL Mouth Rinse 10 times per day  . pantoprazole (PROTONIX) IV  40 mg Intravenous Q12H    Vital Signs    Vitals:   08/27/18 0400 08/27/18 0415 08/27/18 0500 08/27/18 0600  BP: (!) 104/52  (!) 111/53 (!) 104/45  Pulse: 63  (!) 56 (!) 57  Resp: _0 Temp: 98.8 F (37.1 C)  99.1 F (37.3 C) 98.4 F (36.9 C)  TempSrc: Core     SpO2: 95%  95% 95%  Weight:  66.5 kg    Height:        Intake/Output Summary (Last 24 hours) at 08/27/2018 0820 Last data filed at 08/27/2018 0400 Gross per 24 hour  Intake 925 ml  Output 1630 ml  Net -705 ml   Filed Weights   08/21/2018 1307 08/26/18 0439 08/27/18 0415  Weight: 60.8 kg 61.9 kg 66.5 kg    Physical Exam    GEN: Well nourished, well developed HEENT: normal.  Neck: Supple, no JVD, carotid bruits, or masses. Cardiac: Tachycardic. Respiratory: Ventilator in place.  Occasional breathing above. GI: Soft, nontender, nondistended, BS + x 4. MS: no deformity or atrophy. Skin: warm and dry, no rash. Neuro: Agitated.  Had been on fentanyl.  Intubated and not able to answer questions.  Labs    CBC Recent Labs    09/05/2018 1311   08/26/18 0036 08/27/18 0447  WBC 11.1*   < > 14.1* 10.3  NEUTROABS 3.8  --   --  8.7*  HGB 8.7*   < > 8.8* 6.7*  HCT 29.4*   < > 28.0* 21.2*  MCV 94.5   < > 90.6 89.8  PLT 172   < > 177 128*   < > = values in this interval not displayed.   Basic Metabolic Panel Recent Labs    08/26/18 0036 08/27/18 0447  NA 137 141  K 3.8 3.6  CL 104 109  CO2 21* 21*  GLUCOSE 230* 159*  BUN 32* 31*  CREATININE 1.85* 1.93*  CALCIUM 9.2 8.4*  MG 1.6*  --    Liver Function Tests Recent Labs    08/21/2018 1311 08/26/18 0036  AST 1,308* 3,875*  ALT 1,211* 2,263*  ALKPHOS 50 60  BILITOT 0.6 0.9  PROT 6.2* 5.8*  ALBUMIN 2.8* 2.7*   No results for input(s): LIPASE, AMYLASE in the last 72 hours. Cardiac Enzymes Recent Labs    09/03/2018 1626 08/17/2018 2041 08/26/18 0457  TROPONINI 0.30* 0.50* 0.37*   BNP No results for input(s): BNP in the last 72 hours. D-Dimer No results for input(s): DDIMER in the last 72 hours. Hemoglobin A1C  No results for input(s): HGBA1C in the last 72 hours. Fasting Lipid Panel Recent Labs    08/24/2018 1129 09/05/2018 2219  CHOL 170  --   HDL 79  --   LDLCALC 79  --   TRIG 61 72  CHOLHDL 2.2  --    Thyroid Function Tests No results for input(s): TSH, T4TOTAL, T3FREE, THYROIDAB in the last 72 hours.  Invalid input(s): FREET3  Telemetry    Sinus tachycardia with left bundle branch block.  ECG    Sinus tachycardia with left bundle branch block  Radiology    Ct Head Wo Contrast  Result Date: 09/15/2018 CLINICAL DATA:  Patient presented to the emergency department for cardiac arrest. Given intravenous contrast earlier today. She also complained of difficulty breathing earlier today and went to the urgent care with shortness of breath and cough. Patient had a syncopal event while at the lap, subsequently found to have no pulse. CPR begun immediately and EMS called. EXAM: CT HEAD WITHOUT CONTRAST TECHNIQUE: Contiguous axial images were obtained from the  base of the skull through the vertex without intravenous contrast. COMPARISON:  None. FINDINGS: Brain: No evidence of acute infarction, hemorrhage, hydrocephalus, extra-axial collection or mass lesion/mass effect. There is mild ventricular and sulcal enlargement reflecting age-appropriate volume loss. There are mild areas of white matter hypoattenuation consistent with chronic microvascular ischemic change. Vascular: No hyperdense vessel or unexpected calcification. Skull: Normal. Negative for fracture or focal lesion. Sinuses/Orbits: Visualized globes and orbits are unremarkable. The visualized sinuses and mastoid air cells are clear. Other: None. IMPRESSION: 1. No acute intracranial abnormalities. 2. Age-appropriate volume loss and mild chronic microvascular ischemic change. Electronically Signed   By: Lajean Manes M.D.   On: 08/23/2018 15:54   Ct Angio Chest Pe W And/or Wo Contrast  Result Date: 09/06/2018 CLINICAL DATA:  C/o SOB and cough, followed by cardiac arrest EXAM: CT ANGIOGRAPHY CHEST WITH CONTRAST TECHNIQUE: Multidetector CT imaging of the chest was performed using the standard protocol during bolus administration of intravenous contrast. Multiplanar CT image reconstructions and MIPs were obtained to evaluate the vascular anatomy. CONTRAST:  23m OMNIPAQUE IOHEXOL 350 MG/ML SOLN COMPARISON:  Current chest radiograph. FINDINGS: Cardiovascular: There is satisfactory opacification of the pulmonary arteries to the segmental level. There is no evidence of a pulmonary embolism. Heart is mildly enlarged. There are three-vessel coronary artery calcifications. No pericardial effusion. Great vessels are normal in caliber. No aortic dissection minimal aortic atherosclerosis. Mediastinum/Nodes: No neck base, axillary, mediastinal or hilar masses or pathologically enlarged lymph nodes. Trachea is patent. Endotracheal tube tip projects 13 mm above the Carina. Nasal/orogastric tube passes below the diaphragm well  into the stomach. Lungs/Pleura: Small, right greater than left, pleural effusions. There is dependent airspace opacity in both lungs, right greater than left. At least a component of this is presumably atelectasis. There is additional patchy peribronchovascular type airspace consolidation, both confluent and ground-glass, and both lungs, greater on the right, as well as bilateral interstitial thickening that is most evident in the upper lobes. No pneumothorax. Upper Abdomen: No acute abnormality. Musculoskeletal: No fracture or acute finding. No osteoblastic or osteolytic lesions. Review of the MIP images confirms the above findings. IMPRESSION: 1. No evidence of a pulmonary embolism. 2. Dependent bilateral lung consolidation/atelectasis. Atelectasis is presumably a component of the dependent opacity, but there are findings also consistent with pneumonia, particularly on the right. In addition, there are patchy areas of confluent and ground-glass opacities as well as interstitial thickening and small bilateral pleural  effusions that are consistent with pulmonary edema. 3. Mild cardiomegaly and three-vessel coronary artery calcifications. Minor aortic atherosclerosis. 4. Support apparatus is well positioned. Aortic Atherosclerosis (ICD10-I70.0). Electronically Signed   By: Lajean Manes M.D.   On: 08/20/2018 14:39   Dg Chest Port 1 View  Result Date: 08/27/2018 CLINICAL DATA:  Acute respiratory failure. EXAM: PORTABLE CHEST 1 VIEW COMPARISON:  One-view chest x-ray 08/26/2018 FINDINGS: Heart size is exaggerated by low lung volumes. Endotracheal tube is satisfactory in position, 3.5 cm above the carina. NG tube courses off the inferior border of the film. New bibasilar airspace disease is present, left greater than right. A left pleural effusion is suspected. Mild pulmonary vascular congestion is stable. IMPRESSION: 1. New left greater than right basilar airspace disease. While this may represent atelectasis, it is  concerning for infection or possibly aspiration. 2. Left pleural effusion is suspected. 3. Mild pulmonary vascular congestion. 4. Support apparatus is stable. Electronically Signed   By: San Morelle M.D.   On: 08/27/2018 08:12   Dg Chest Port 1 View  Result Date: 08/26/2018 CLINICAL DATA:  Acute respiratory failure. EXAM: PORTABLE CHEST 1 VIEW COMPARISON:  Chest radiograph and CTA 08/17/2018 FINDINGS: Endotracheal tube appears to have been withdrawn since the prior radiograph and now terminates 3 cm above the carina. Enteric tube courses into the left upper abdomen with tip not imaged. Airspace opacities on the prior radiograph have substantially improved. Residual opacities are most notable in the right lower lobe and left upper lobe. There are likely persistent small bilateral pleural effusions. No pneumothorax is identified. IMPRESSION: Interval improvement of bilateral airspace disease. Electronically Signed   By: Logan Bores M.D.   On: 08/26/2018 11:38   Dg Chest Portable 1 View  Result Date: 08/23/2018 CLINICAL DATA:  Intubation. EXAM: PORTABLE CHEST 1 VIEW COMPARISON:  Chest x-ray 10/22/2012. FINDINGS: Endotracheal tube tip noted at the level of the carina, proximal repositioning of approximately 4 cm suggested. NG tube noted with tip below left hemidiaphragm. Cardiomegaly. Diffuse bilateral pulmonary infiltrates/edema. Cardiac B-lines noted. Findings most consistent with congestive heart failure with bilateral pulmonary edema. IMPRESSION: 1. Endotracheal tube tip noted just above the carina. Proximal repositioning of approximately 4 cm suggested. NG tube noted with tip below left hemidiaphragm. 2. Findings consistent with congestive heart failure with bilateral pulmonary edema. Critical Value/emergent results were called by telephone at the time of interpretation on 09/08/2018 at 1:48 pm to Dr. Fransisco Hertz, who verbally acknowledged these results. Electronically Signed   By: Marcello Moores  Register    On: 08/19/2018 13:52    Assessment & Plan    85 year old female status post cardiac arrest requiring CPR and emergent intubation.  Had a mild serum troponin elevation likely secondary to CPR and arrest.  Peak troponin was 0.5.  Is trending down. She received TPA with resultant prolonged INR.  This is improved with last INR of 1.97.  Etiology of event is unclear.  Echo showed ejection fraction of 35 to 40% globally.  Previous ejection fraction was estimated by functional study in 2014 was reported as normal with no ischemia.  Cardiac arrest-etiology unclear.  Brain CT revealed no acute findings.  Chest CT revealed no pulmonary embolus.  Chest x-ray shows small bilateral pleural effusions.  No pneumothorax.  Echo showed no pericardial effusion.  We will continue to wean ventilator as tolerated.  Titrate pressors as tolerated.  Follow renal function.  When stable both hemodynamically, with regard to electrolytes and renal function as well as neuro status, would  consider cardiac cath to evaluate coronary anatomy to guide further therapy.  Not indicated at present.  He kidney disease-likely secondary to CPR and cardiac arrest.  Will follow.  Airspace disease-on empiric antibiotics. Signed, Javier Docker Vernette Moise MD 08/27/2018, 8:20 AM  Pager: [phone]

## 2018-08-27 NOTE — Progress Notes (Signed)
Follow up - Critical Care Medicine Note  Patient Details:    Laura [name] is an 85 y.o. female. 85 yo female with CKD stage 3 admitted 01/9 following recurrent cardiac arrest, acute hypoxic hypercapnic respiratory failure secondary pneumonia, acute on chronic renal failure with hyperkalemia, septic shock, and cardiogenic shock requiring mechanical ventilation and pressors  Lines, Airways, Drains: Airway 7.5 mm (Active)  Secured at (cm) 23 cm 08/27/2018  8:53 PM  Measured From Lips 08/27/2018  8:53 PM  Secured Location Right 08/27/2018  8:53 PM  Secured By [employer] 08/27/2018  8:53 PM  Tube Holder Repositioned Yes 08/27/2018  8:53 PM  Cuff Pressure (cm H2O) 24 cm H2O 08/27/2018  8:53 PM  Site Condition Dry 08/27/2018  8:53 PM  Date Prophylactic Dressing Applied (if applicable) 45/03/88 04/13/33 12:00 PM     NG/OG Tube Orogastric Center mouth Xray 63 cm (Active)  Cm Marking at Nare/Corner of Mouth (if applicable) 63 cm 05/03/9149  8:00 PM  External Length of Tube (cm) - (if applicable) 63 cm 5/69/7948  4:15 AM  Site Assessment Intact;Dry 08/27/2018  8:00 PM  Date Prophylactic Dressing Applied (if applicable) 01/65/53 7/48/2707  8:00 AM  Ongoing Placement Verification No change in cm markings or external length of tube from initial placement;No change in respiratory status;No acute changes, not attributed to clinical condition 08/27/2018  8:00 PM  Status Suction-low intermittent 08/27/2018  8:00 PM  Amount of suction 80 mmHg 08/27/2018  8:00 PM  Drainage Appearance Brown;Clear 08/27/2018  8:00 PM  Output (mL) 50 mL 08/27/2018  6:08 PM     Urethral Catheter Levada Dy, Rn (Active)  Indication for Insertion or Continuance of Catheter Unstable critical patients (first 24-48 hours) 08/27/2018  8:00 PM  Site Assessment Clean;Intact 08/27/2018  8:00 PM  Catheter Maintenance Seal intact;No dependent loops;Insertion date on drainage bag;Drainage bag/tubing not touching floor;Catheter  secured;Bag below level of bladder 08/27/2018  8:00 PM  Collection Container Standard drainage bag 08/27/2018  8:00 PM  Securement Method Securing device (Describe) 08/27/2018  8:00 PM  Urinary Catheter Interventions Unclamped 08/27/2018  8:00 PM  Output (mL) 150 mL 08/27/2018  6:08 PM    Anti-infectives:  Anti-infectives (From admission, onward)   Start     Dose/Rate Route Frequency Ordered Stop   08/27/18 1600  vancomycin (VANCOCIN) IVPB 750 mg/150 ml premix  Status:  Discontinued     750 mg 150 mL/hr over 60 Minutes Intravenous Every 48 hours 09/15/2018 1527 08/19/2018 1636   08/27/18 1600  vancomycin (VANCOCIN) IVPB 750 mg/150 ml premix  Status:  Discontinued     750 mg 150 mL/hr over 60 Minutes Intravenous Every 48 hours 08/26/18 0523 08/26/18 1108   08/26/18 1800  cefTRIAXone (ROCEPHIN) 1 g in sodium chloride 0.9 % 100 mL IVPB     1 g 200 mL/hr over 30 Minutes Intravenous Every 24 hours 08/26/18 1108     08/26/18 1600  cefTRIAXone (ROCEPHIN) 1 g in sodium chloride 0.9 % 100 mL IVPB  Status:  Discontinued     1 g 200 mL/hr over 30 Minutes Intravenous Every 24 hours 08/26/2018 1636 08/26/18 0523   08/26/18 1600  ceFEPIme (MAXIPIME) 1 g in sodium chloride 0.9 % 100 mL IVPB  Status:  Discontinued     1 g 200 mL/hr over 30 Minutes Intravenous Every 24 hours 08/26/18 0523 08/26/18 1108   08/26/18 1500  azithromycin (ZITHROMAX) 500 mg in sodium chloride 0.9 % 250 mL IVPB     500 mg  250 mL/hr over 60 Minutes Intravenous Every 24 hours 08/18/2018 1636     08/30/2018 1600  ceFEPIme (MAXIPIME) 1 g in sodium chloride 0.9 % 100 mL IVPB  Status:  Discontinued     1 g 200 mL/hr over 30 Minutes Intravenous Every 24 hours 09/16/2018 1527 08/19/2018 1636   08/20/2018 1600  vancomycin (VANCOCIN) 1,500 mg in sodium chloride 0.9 % 500 mL IVPB  Status:  Discontinued     1,500 mg 250 mL/hr over 120 Minutes Intravenous  Once 09/05/2018 1527 09/04/2018 1636   08/23/2018 1445  cefTRIAXone (ROCEPHIN) 1 g in sodium chloride 0.9 %  100 mL IVPB     1 g 200 mL/hr over 30 Minutes Intravenous  Once 09/01/2018 1434 09/08/2018 1452   08/31/2018 1445  azithromycin (ZITHROMAX) 500 mg in sodium chloride 0.9 % 250 mL IVPB     500 mg 250 mL/hr over 60 Minutes Intravenous  Once 09/08/2018 1434 09/03/2018 1600      Microbiology: Results for orders placed or performed during the hospital encounter of 09/13/2018  MRSA PCR Screening     Status: None   Collection Time: 08/31/2018  5:11 PM  Result Value Ref Range Status   MRSA by PCR NEGATIVE NEGATIVE Final    Comment:        The GeneXpert MRSA Assay (FDA approved for NASAL specimens only), is one component of a comprehensive MRSA colonization surveillance program. It is not intended to diagnose MRSA infection nor to guide or monitor treatment for MRSA infections. Performed at [facility], [address] [place], [place] [address]     Best Practice/Protocols:  VTE Prophylaxis: Mechanical GI Prophylaxis: Proton Pump Inhibitor Vent Management  Events: 25 August 2018 admitted to ICU intubated on mechanical ventilation post cardiac arrest (multiple times)  Studies: Ct Head Wo Contrast  Result Date: 08/20/2018 CLINICAL DATA:  Patient presented to the emergency department for cardiac arrest. Given intravenous contrast earlier today. She also complained of difficulty breathing earlier today and went to the urgent care with shortness of breath and cough. Patient had a syncopal event while at the lap, subsequently found to have no pulse. CPR begun immediately and EMS called. EXAM: CT HEAD WITHOUT CONTRAST TECHNIQUE: Contiguous axial images were obtained from the base of the skull through the vertex without intravenous contrast. COMPARISON:  None. FINDINGS: Brain: No evidence of acute infarction, hemorrhage, hydrocephalus, extra-axial collection or mass lesion/mass effect. There is mild ventricular and sulcal enlargement reflecting age-appropriate volume loss. There are mild areas of  white matter hypoattenuation consistent with chronic microvascular ischemic change. Vascular: No hyperdense vessel or unexpected calcification. Skull: Normal. Negative for fracture or focal lesion. Sinuses/Orbits: Visualized globes and orbits are unremarkable. The visualized sinuses and mastoid air cells are clear. Other: None. IMPRESSION: 1. No acute intracranial abnormalities. 2. Age-appropriate volume loss and mild chronic microvascular ischemic change. Electronically Signed   By: Lajean Manes M.D.   On: 09/13/2018 15:54   Ct Angio Chest Pe W And/or Wo Contrast  Result Date: 09/05/2018 CLINICAL DATA:  C/o SOB and cough, followed by cardiac arrest EXAM: CT ANGIOGRAPHY CHEST WITH CONTRAST TECHNIQUE: Multidetector CT imaging of the chest was performed using the standard protocol during bolus administration of intravenous contrast. Multiplanar CT image reconstructions and MIPs were obtained to evaluate the vascular anatomy. CONTRAST:  1m OMNIPAQUE IOHEXOL 350 MG/ML SOLN COMPARISON:  Current chest radiograph. FINDINGS: Cardiovascular: There is satisfactory opacification of the pulmonary arteries to the segmental level. There is no evidence of  a pulmonary embolism. Heart is mildly enlarged. There are three-vessel coronary artery calcifications. No pericardial effusion. Great vessels are normal in caliber. No aortic dissection minimal aortic atherosclerosis. Mediastinum/Nodes: No neck base, axillary, mediastinal or hilar masses or pathologically enlarged lymph nodes. Trachea is patent. Endotracheal tube tip projects 13 mm above the Carina. Nasal/orogastric tube passes below the diaphragm well into the stomach. Lungs/Pleura: Small, right greater than left, pleural effusions. There is dependent airspace opacity in both lungs, right greater than left. At least a component of this is presumably atelectasis. There is additional patchy peribronchovascular type airspace consolidation, both confluent and ground-glass, and  both lungs, greater on the right, as well as bilateral interstitial thickening that is most evident in the upper lobes. No pneumothorax. Upper Abdomen: No acute abnormality. Musculoskeletal: No fracture or acute finding. No osteoblastic or osteolytic lesions. Review of the MIP images confirms the above findings. IMPRESSION: 1. No evidence of a pulmonary embolism. 2. Dependent bilateral lung consolidation/atelectasis. Atelectasis is presumably a component of the dependent opacity, but there are findings also consistent with pneumonia, particularly on the right. In addition, there are patchy areas of confluent and ground-glass opacities as well as interstitial thickening and small bilateral pleural effusions that are consistent with pulmonary edema. 3. Mild cardiomegaly and three-vessel coronary artery calcifications. Minor aortic atherosclerosis. 4. Support apparatus is well positioned. Aortic Atherosclerosis (ICD10-I70.0). Electronically Signed   By: Lajean Manes M.D.   On: 08/17/2018 14:39   Dg Chest Port 1 View  Result Date: 08/27/2018 CLINICAL DATA:  Acute respiratory failure. EXAM: PORTABLE CHEST 1 VIEW COMPARISON:  One-view chest x-ray 08/26/2018 FINDINGS: Heart size is exaggerated by low lung volumes. Endotracheal tube is satisfactory in position, 3.5 cm above the carina. NG tube courses off the inferior border of the film. New bibasilar airspace disease is present, left greater than right. A left pleural effusion is suspected. Mild pulmonary vascular congestion is stable. IMPRESSION: 1. New left greater than right basilar airspace disease. While this may represent atelectasis, it is concerning for infection or possibly aspiration. 2. Left pleural effusion is suspected. 3. Mild pulmonary vascular congestion. 4. Support apparatus is stable. Electronically Signed   By: San Morelle M.D.   On: 08/27/2018 08:12   Dg Chest Port 1 View  Result Date: 08/26/2018 CLINICAL DATA:  Acute respiratory  failure. EXAM: PORTABLE CHEST 1 VIEW COMPARISON:  Chest radiograph and CTA 09/16/2018 FINDINGS: Endotracheal tube appears to have been withdrawn since the prior radiograph and now terminates 3 cm above the carina. Enteric tube courses into the left upper abdomen with tip not imaged. Airspace opacities on the prior radiograph have substantially improved. Residual opacities are most notable in the right lower lobe and left upper lobe. There are likely persistent small bilateral pleural effusions. No pneumothorax is identified. IMPRESSION: Interval improvement of bilateral airspace disease. Electronically Signed   By: Logan Bores M.D.   On: 08/26/2018 11:38   Dg Chest Portable 1 View  Result Date: 09/12/2018 CLINICAL DATA:  Intubation. EXAM: PORTABLE CHEST 1 VIEW COMPARISON:  Chest x-ray 10/22/2012. FINDINGS: Endotracheal tube tip noted at the level of the carina, proximal repositioning of approximately 4 cm suggested. NG tube noted with tip below left hemidiaphragm. Cardiomegaly. Diffuse bilateral pulmonary infiltrates/edema. Cardiac B-lines noted. Findings most consistent with congestive heart failure with bilateral pulmonary edema. IMPRESSION: 1. Endotracheal tube tip noted just above the carina. Proximal repositioning of approximately 4 cm suggested. NG tube noted with tip below left hemidiaphragm. 2. Findings consistent with congestive  heart failure with bilateral pulmonary edema. Critical Value/emergent results were called by telephone at the time of interpretation on 08/18/2018 at 1:48 pm to Dr. Fransisco Hertz, who verbally acknowledged these results. Electronically Signed   By: Marcello Moores  Register   On: 09/12/2018 13:52   [place] Ekg Site Rite  Result Date: 08/27/2018 If Site Rite image not attached, placement could not be confirmed due to current cardiac rhythm.   Consults: Treatment Team:  Pccm, Ander Gaster, MD Teodoro Spray, MD   Subjective:    Overnight Issues:  Had issues with bradycardia last night  and Precedex stopped briefly.  She did not pass spontaneous breathing trial this morning due to tachypnea.  Placed back on low-dose Precedex and tolerating.  She is somewhat agitated without sedation.  Oozing of blood from around the mouth has decreased.  Tongue appears less swollen. Objective:  Vital signs for last 24 hours: Temp:  [96.4 F (35.8 C)-100.4 F (38 C)] 98.8 F (37.1 C) (01/11 2000) Pulse Rate:  [47-111] 69 (01/11 2000) Resp:  [0-26] 14 (01/11 2000) BP: (88-132)/(35-77) 129/53 (01/11 2000) SpO2:  [93 %-100 %] 100 % (01/11 2000) FiO2 (%):  [30 %] 30 % (01/11 2000) Weight:  [66.5 kg] 66.5 kg (01/11 0415)  Hemodynamic parameters for last 24 hours:    Intake/Output from previous day: 01/10 0701 - 01/11 0700 In: 925 [I.V.:875; IV Piggyback:50] Out: 1630 [Urine:880; Emesis/NG output:750]  Intake/Output this shift: No intake/output data recorded.  Vent settings for last 24 hours: Vent Mode: PRVC FiO2 (%):  [30 %] 30 % Set Rate:  [16 bmp] 16 bmp Vt Set:  [500 mL] 500 mL PEEP:  [5 cmH20] 5 cmH20 Plateau Pressure:  [17 [id number] cmH20] 18 cmH20  Physical Exam:  General: acutely ill appearing female, orotracheally intubated Neuro: sedated, withdraws from painful stimulation, PERRL HEENT: 2+ tongue edema with dried blood ,  Neck: no JVD  Cardiovascular: sinus brady, no R/G Lungs: clear throughout, even, non labored  Abdomen: +BS x4, soft, obese, non distended  Musculoskeletal: normal bulk and tone, no edema Skin: intact no rashes or lesions present   No leak noted when ETT cuff deflated. Assessment/Plan:  Acute hypoxic hypercapnic respiratory failure secondary to cardiac arrest in the setting of pneumonia versus URI. Mechanical ventilation  full vent support for now-vent settings reviewed and discussed with RT.  Limitations to extubation will be mental status and edema of upper airway. SBT once all parameters met VAP bundle implemented  PRN bronchodilator  therapy  Recurrent cardiac arrest (PEA/Ventricular Tachycardia) Cardiogenic shock  Elevated troponin secondary to demand ischemia vs. NSTEMI Hx: HTN Continuous telemetry monitoring Trend troponin's Cardiology consulted appreciate input Hold outpatient antihypertensives 2D echo confirms 30 to 35% EF.  Acute on chronic renal failure with hyperkalemia  Trend BMP  Replace electrolytes as indicated Monitor UOP Avoid nephrotoxic medications   Transaminitis secondary to septic and cardiogenic shock- shock liver Trend hepatic panel  Anemia with acute blood loss s/p TPA treated empirically for possible pulmonary embolism (CT negative for PE 08/25/18) Trend CBC Monitor for s/sx of bleeding and transfuse for hgb <7 (hemoglobin below 7 today will transfuse) Repeat coags pending if elevated will give FFP and vitamin K  Type II Diabetes Mellitus CBG's q4hrs SSI   Acute encephalopathy concerning for possible anoxic injury secondary to recurrent cardiac arrest  Mechanical ventilation continuous sedation protocol  maintain RASS goal 0 to -1 Precedex gtt to maintain RASS goal, wean Fentanyl gtt off  WUA daily, she has been difficult  to sedate due to no good balance level of sedation.  She is either agitated or totally sedated.   Repeat CT if encephalopathy does not improve.     LOS: 2 days   Additional comments:Discussed with family at bedside.  Critical Care Total Time*: 45 Minutes  C. Derrill Kay, MD 08/27/2018  *Care during the described time interval was provided by me and/or other providers on the critical care team.  I have reviewed this patient's available data, including medical history, events of note, physical examination and test results as part of my evaluation.

## 2018-08-27 NOTE — Progress Notes (Signed)
Patient remains intubated. Alert. Family at bedside.  No acute concerns.  Report given to oncoming nurse.

## 2018-08-27 NOTE — Progress Notes (Signed)
Chaplain followed up with Pt from previous visit. Family was at the bedside. Chaplain encouraged family as the Pt had no children but has a tremendous support system. Chaplain prayed for Pt and family.    08/27/18 1200  Clinical Encounter Type  Visited With Patient and family together  Visit Type Follow-up  Referral From Chaplain  Spiritual Encounters  Spiritual Needs Prayer

## 2018-08-27 NOTE — Progress Notes (Signed)
Busy day - Attempted extubation x 3 without success. Given Decadron after last attempt. Family in from many states. Family does not wish to follow visitation policy. Patient over tired. Everyone sent away for 1700 bath time.

## 2018-08-28 ENCOUNTER — Other Ambulatory Visit: Payer: Self-pay

## 2018-08-28 ENCOUNTER — Inpatient Hospital Stay: Payer: Medicare Other

## 2018-08-28 DIAGNOSIS — K7200 Acute and subacute hepatic failure without coma: Secondary | ICD-10-CM

## 2018-08-28 LAB — CBC
HCT: 25.4 % — ABNORMAL LOW (ref 36.0–46.0)
Hemoglobin: 8 g/dL — ABNORMAL LOW (ref 12.0–15.0)
MCH: 27.9 pg (ref 26.0–34.0)
MCHC: 31.5 g/dL (ref 30.0–36.0)
MCV: 88.5 fL (ref 80.0–100.0)
Platelets: 127 10*3/uL — ABNORMAL LOW (ref 150–400)
RBC: 2.87 MIL/uL — ABNORMAL LOW (ref 3.87–5.11)
RDW: 16 % — ABNORMAL HIGH (ref 11.5–15.5)
WBC: 14.6 10*3/uL — ABNORMAL HIGH (ref 4.0–10.5)
nRBC: 1.3 % — ABNORMAL HIGH (ref 0.0–0.2)

## 2018-08-28 LAB — GLUCOSE, CAPILLARY
GLUCOSE-CAPILLARY: 114 mg/dL — AB (ref 70–99)
Glucose-Capillary: 162 mg/dL — ABNORMAL HIGH (ref 70–99)
Glucose-Capillary: 119 mg/dL — ABNORMAL HIGH (ref 70–99)
Glucose-Capillary: 165 mg/dL — ABNORMAL HIGH (ref 70–99)
Glucose-Capillary: 196 mg/dL — ABNORMAL HIGH (ref 70–99)
Glucose-Capillary: 150 mg/dL — ABNORMAL HIGH (ref 70–99)

## 2018-08-28 LAB — PHOSPHORUS: Phosphorus: 3.1 mg/dL (ref 2.5–4.6)

## 2018-08-28 LAB — COMPREHENSIVE METABOLIC PANEL
ALBUMIN: 2.9 g/dL — AB (ref 3.5–5.0)
ALT: 960 U/L — ABNORMAL HIGH (ref 0–44)
AST: 439 U/L — ABNORMAL HIGH (ref 15–41)
Alkaline Phosphatase: 54 U/L (ref 38–126)
Anion gap: 8 (ref 5–15)
BUN: 41 mg/dL — ABNORMAL HIGH (ref 8–23)
CO2: 21 mmol/L — AB (ref 22–32)
Calcium: 8.2 mg/dL — ABNORMAL LOW (ref 8.9–10.3)
Chloride: 110 mmol/L (ref 98–111)
Creatinine, Ser: 2.32 mg/dL — ABNORMAL HIGH (ref 0.44–1.00)
GFR calc Af Amer: 21 mL/min — ABNORMAL LOW (ref >60)
GFR calc non Af Amer: 18 mL/min — ABNORMAL LOW (ref >60)
Glucose, Bld: 155 mg/dL — ABNORMAL HIGH (ref 70–99)
Potassium: 3.3 mmol/L — ABNORMAL LOW (ref 3.5–5.1)
Sodium: 139 mmol/L (ref 135–145)
Total Bilirubin: 0.7 mg/dL (ref 0.3–1.2)
Total Protein: 5.8 g/dL — ABNORMAL LOW (ref 6.5–8.1)

## 2018-08-28 LAB — MAGNESIUM: Magnesium: 2 mg/dL (ref 1.7–2.4)

## 2018-08-28 MED ORDER — FUROSEMIDE 10 MG/ML IJ SOLN
40.0000 mg | Freq: Once | INTRAMUSCULAR | Status: AC
  Administered 2018-08-28: 40 mg via INTRAVENOUS
  Filled 2018-08-28: qty 4

## 2018-08-28 MED ORDER — FUROSEMIDE 10 MG/ML IJ SOLN
80.0000 mg | Freq: Once | INTRAMUSCULAR | Status: AC
  Administered 2018-08-28: 80 mg via INTRAVENOUS
  Filled 2018-08-28: qty 8

## 2018-08-28 MED ORDER — INFLUENZA VAC SPLIT HIGH-DOSE 0.5 ML IM SUSY: 0.5000 mL | PREFILLED_SYRINGE | INTRAMUSCULAR | Status: DC | PRN

## 2018-08-28 MED ORDER — PNEUMOCOCCAL VAC POLYVALENT 25 MCG/0.5ML IJ INJ: 0.5000 mL | INJECTION | INTRAMUSCULAR | Status: DC | PRN

## 2018-08-28 MED ORDER — METOPROLOL TARTRATE 5 MG/5ML IV SOLN
5.0000 mg | Freq: Three times a day (TID) | INTRAVENOUS | Status: DC
  Administered 2018-08-28 (×2): 5 mg via INTRAVENOUS
  Filled 2018-08-28 (×2): qty 5

## 2018-08-28 MED ORDER — DEXAMETHASONE SODIUM PHOSPHATE 10 MG/ML IJ SOLN
10.0000 mg | Freq: Once | INTRAMUSCULAR | Status: AC
  Administered 2018-08-28: 10 mg via INTRAVENOUS
  Filled 2018-08-28: qty 1

## 2018-08-28 MED ORDER — ORAL CARE MOUTH RINSE
15.0000 mL | Freq: Two times a day (BID) | OROMUCOSAL | Status: DC
  Administered 2018-08-28: 15 mL via OROMUCOSAL

## 2018-08-28 MED ORDER — CHLORHEXIDINE GLUCONATE 0.12 % MT SOLN
15.0000 mL | Freq: Two times a day (BID) | OROMUCOSAL | Status: DC
  Administered 2018-08-28: 15 mL via OROMUCOSAL
  Filled 2018-08-28: qty 15

## 2018-08-28 NOTE — Progress Notes (Signed)
 Patient ID: Laura [name], female   DOB: 07/14/1928, 85 y.o.   MRN: [id number]  [facility] PROGRESS NOTE  Laura [name] WUJ:[id number] DOB: 07/14/1928 DOA: 08/21/2018 PCP: Duanne LimerickJones, Deanna C, MD  HPI/Subjective: Patient had an arrest yesterday.  Currently on the ventilator.  Received TPA by ER physician.  Could not tolerate trial of weaning sedation last 2 days. Plan for trial of weaning again today.  Objective: Vitals:   08/28/18 2000 08/28/18 2100  BP: 124/66 (!) 117/57  Pulse: 90 82  Resp: (!) 9 12  Temp: 98.6 F (37 C)   SpO2: 95% 98%    Filed Weights   08/26/18 0439 08/27/18 0415 08/28/18 0500  Weight: 61.9 kg 66.5 kg 68.1 kg    ROS: Review of Systems  Unable to perform ROS: Acuity of condition   Exam: Physical Exam  HENT:  Nose: No mucosal edema.  Some tongue swelling and blood around the tongue.  Eyes: Pupils are equal, round, and reactive to light. Conjunctivae and lids are normal.  Neck: No JVD present. Carotid bruit is not present. No edema present. No thyroid mass and no thyromegaly present.  Cardiovascular: S1 normal and S2 normal. Exam reveals no gallop.  No murmur heard. Pulses:      Dorsalis pedis pulses are 2+ on the right side and 2+ on the left side.  Respiratory: No respiratory distress. She has decreased breath sounds in the right lower field and the left lower field. She has no wheezes. She has no rhonchi. She has no rales.  GI: Soft. Bowel sounds are normal. There is no abdominal tenderness.  Musculoskeletal:     Right ankle: She exhibits no swelling.     Left ankle: She exhibits no swelling.  Lymphadenopathy:    She has no cervical adenopathy.  Neurological:  Sedated on ventilator.  Skin: Skin is warm. No rash noted. Nails show no clubbing.  Psychiatric:  Sedated on ventilator      Data Reviewed: Basic Metabolic Panel: Recent Labs  Lab 08/26/2018 1311  09/16/2018 1351 08/23/2018 1619  1626 08/26/18 0036  08/27/18 0447 08/28/18 0616  NA 141   < > 136 135  --  137 141 139  K 6.2*   < > 3.8 3.8  --  3.8 3.6 3.3*  CL 102   < > 104 105  --  104 109 110  CO2 19*  --   --  17*  --  21* 21* 21*  GLUCOSE 286*   < > 373* 268*  --  230* 159* 155*  BUN 29*   < > 24* 31*  --  32* 31* 41*  CREATININE 2.03*   < > 1.80* 1.88* 1.89* 1.85* 1.93* 2.32*  CALCIUM 14.8*  --   --  9.4  --  9.2 8.4* 8.2*  MG  --   --   --   --   --  1.6*  --  2.0  PHOS  --   --   --   --   --   --   --  3.1   < > = values in this interval not displayed.   Liver Function Tests: Recent Labs  Lab 08/17/2018 1311 08/26/18 0036 08/28/18 0616  AST 1,308* 3,875* 439*  ALT 1,211* 2,263* 960*  ALKPHOS 50 60 54  BILITOT 0.6 0.9 0.7  PROT 6.2* 5.8* 5.8*  ALBUMIN 2.8* 2.7* 2.9*   CBC: Recent Labs  Lab 08/21/2018 1311  08/17/2018 1450 09/04/2018 1621 08/26/18  0036 08/27/18 0447 08/27/18 1709 08/28/18 0616  WBC 11.1*  --  12.0* 14.0* 14.1* 10.3  --  14.6*  NEUTROABS 3.8  --   --   --   --  8.7*  --   --   HGB 8.7*   < > 8.2* 8.7* 8.8* 6.7* 8.5* 8.0*  HCT 29.4*   < > 26.8* 28.6* 28.0* 21.2* 27.5* 25.4*  MCV 94.5  --  92.4 92.3 90.6 89.8  --  88.5  PLT 172  --  158 171 177 128*  --  127*   < > = values in this interval not displayed.   Cardiac Enzymes: Recent Labs  Lab 08/17/2018 1311 09/08/2018 1626 09/09/2018 2041 08/26/18 0457  TROPONINI 0.04* 0.30* 0.50* 0.37*    CBG: Recent Labs  Lab 08/28/18 0328 08/28/18 0747 08/28/18 1129 08/28/18 1706 08/28/18 1949  GLUCAP 162* 119* 165* 196* 150*    Recent Results (from the past 240 hour(s))  MRSA PCR Screening     Status: None   Collection Time: 08/20/2018  5:11 PM  Result Value Ref Range Status   MRSA by PCR NEGATIVE NEGATIVE Final    Comment:        The GeneXpert MRSA Assay (FDA approved for NASAL specimens only), is one component of a comprehensive MRSA colonization surveillance program. It is not intended to diagnose MRSA infection nor to guide or monitor  treatment for MRSA infections. Performed at [facility], [address], [place], [place] [address]      Studies: Dg Chest [facility] 1 View  Result Date: 08/28/2018 CLINICAL DATA:  Shortness of breath, endotracheal tube placement. EXAM: PORTABLE CHEST 1 VIEW COMPARISON:  Radiograph August 28, 2018. FINDINGS: Stable cardiomediastinal silhouette. Endotracheal tube is in grossly good position. Distal tip of nasogastric tube remains within the mid esophagus. No pneumothorax is noted. Stable bibasilar atelectasis is noted with probable minimal pleural effusions. Bony thorax is unremarkable. IMPRESSION: Endotracheal tube in grossly good position. Distal tip of nasogastric tube remains within the mid esophagus and advancement is recommended. Stable bibasilar opacities as described above. Electronically Signed   By: Lupita Raider, M.D.   On: 08/28/2018 07:08   Dg Chest Port 1 View  Result Date: 08/28/2018 CLINICAL DATA:  Acute respiratory failure EXAM: PORTABLE CHEST 1 VIEW COMPARISON:  Chest radiograph 08/27/2018 FINDINGS: ET tube mid trachea. Interval retraction enteric tube with tip terminating the midesophagus. Monitoring leads overlie the patient. Stable cardiomegaly. Minimal heterogeneous opacities right lung base. No pleural effusion or pneumothorax. IMPRESSION: Interval retraction enteric tube now terminating in the mid esophagus. Recommend advancement. Heterogeneous opacities right lung base favored to represent atelectasis. Aspiration not excluded. These results will be called to the ordering clinician or representative by the Radiologist Assistant, and communication documented in the PACS or zVision Dashboard. Electronically Signed   By: Annia Belt M.D.   On: 08/28/2018 06:29   Dg Chest Port 1 View  Result Date: 08/27/2018 CLINICAL DATA:  Acute respiratory failure. EXAM: PORTABLE CHEST 1 VIEW COMPARISON:  One-view chest x-ray 08/26/2018 FINDINGS: Heart size is exaggerated by low lung  volumes. Endotracheal tube is satisfactory in position, 3.5 cm above the carina. NG tube courses off the inferior border of the film. New bibasilar airspace disease is present, left greater than right. A left pleural effusion is suspected. Mild pulmonary vascular congestion is stable. IMPRESSION: 1. New left greater than right basilar airspace disease. While this may represent atelectasis, it is concerning for infection or possibly aspiration.  2. Left pleural effusion is suspected. 3. Mild pulmonary vascular congestion. 4. Support apparatus is stable. Electronically Signed   By: Marin Roberts M.D.   On: 08/27/2018 08:12   [place] Ekg Site Rite  Result Date: 08/27/2018 If Site Rite image not attached, placement could not be confirmed due to current cardiac rhythm.   Scheduled Meds: . chlorhexidine  15 mL Mouth Rinse BID  . fentaNYL (SUBLIMAZE) injection  50 mcg Intravenous Once  . insulin aspart  0-9 Units Subcutaneous Q4H  . mouth rinse  15 mL Mouth Rinse q12n4p  . metoprolol tartrate  5 mg Intravenous Q8H  . pantoprazole (PROTONIX) IV  40 mg Intravenous Q12H   Continuous Infusions: . cefTRIAXone (ROCEPHIN)  IV Stopped (08/28/18 1802)    Assessment/Plan:  1. Cardiac arrest.  Supportive care.  Currently on the ventilator.         Pulmonary following to manage ventilator.  Patient received TPA by ER        For suspected PE- ruled out by CT angio.     Seen by cardiology- Plan for cath- if and when pt stable and improve renal func. 2. Pneumonia on CT scan.  On Rocephin and Zithromax.  Patient on stress dose steroids 3. Shock liver.  Continue to monitor liver function test- improving. 4. Acute kidney injury on chronic kidney disease.( baseline creatinin 1.7-1.8)  Continue to monitor kidney function Anemia with bleeding.  Continue to watch hemoglobin. Given transfusion.  Code Status:     Code Status Orders  (From admission, onward)         Start     Ordered   09/15/2018 1626  Full  code  Continuous     08/19/2018 1625        Code Status History    This patient has a current code status but no historical code status.     Family Communication: Family at bedside Disposition Plan: To be determined  Consultants:  Critical care specialist  Cardiology  Antibiotics:  Rocephin  Zithromax  Time spent: 35 minutes  [employer]

## 2018-08-28 NOTE — Progress Notes (Signed)
Patient became very agitated and pulled out OG tube and fighting ventilator. PRN versed given. Family at bedside. Charge RN and ICU MD at bedside.

## 2018-08-28 NOTE — Progress Notes (Signed)
Patient desatted into the 80s on high flow nasal cannula shortly after extubation.Patient was lethargic. Tried several time to sternal rub patient awake and instruct her to take some deep breaths through her nose. This was unsuccessful. RT consulted. ICU MD notified and came to bedside. Received new orders to place patient on bipap. Family aware of situation. ICU MD advised that nursing staff not to give a bath at this time. After patient was placed on bipap, O2 sats increased to the 90s.

## 2018-08-28 NOTE — Progress Notes (Signed)
Patient placed on spontaneous mode by RT. Patient not breathing on own at this time. Patient placed back on original ventilator settings by RT. Patient desaturated to 80s. O2 probe changed. Patient suctioned several times. Will continue to monitor.

## 2018-08-28 NOTE — Progress Notes (Signed)
Pateint extubated per MD request to [facility] 50l and 80%. Patient tol well at this time.

## 2018-08-28 NOTE — Progress Notes (Signed)
Patients cuff leak assessed, with cuff deflated patient was still pulling 470 - 485 of set 500 tidal volume on ventilator with no audible leak  heard.

## 2018-08-28 NOTE — Progress Notes (Signed)
Spoke with Lindwood QuaAleksey RN regarding that Nephrology approval is needed for both either a Midline or PICC placement per policy. Also informed her that this RN assessed the patients upper arms with ultrasound yesterday and found veins too deep for Midline placement. Lindwood Qualeksey stated she will notify MD

## 2018-08-28 NOTE — Progress Notes (Signed)
Follow up - Critical Care Medicine Note  Patient Details:    Laura [name] is an 85 y.o. female. 85 yo female with CKD stage 3 admitted 01/9 following recurrent cardiac arrest, acute hypoxic/hypercapnic respiratory failure secondary pneumonia, acute on chronic renal failure with hyperkalemia, septic shock, and cardiogenic shock requiring mechanical ventilation and pressors  Lines, Airways, Drains: Airway 7.5 mm (Active)  Secured at (cm) 23 cm 08/27/2018  8:53 PM  Measured From Lips 08/27/2018  8:53 PM  Secured Location Right 08/27/2018  8:53 PM  Secured By [employer] Tube Holder 08/27/2018  8:53 PM  Tube Holder Repositioned Yes 08/27/2018  8:53 PM  Cuff Pressure (cm H2O) 24 cm H2O 08/27/2018  8:53 PM  Site Condition Dry 08/27/2018  8:53 PM  Date Prophylactic Dressing Applied (if applicable) 09/01/2018 08/27/2018 12:00 PM     NG/OG Tube Orogastric Center mouth Xray 63 cm (Active)  Cm Marking at Nare/Corner of Mouth (if applicable) 63 cm 08/27/2018  8:00 PM  External Length of Tube (cm) - (if applicable) 63 cm 08/27/2018  4:15 AM  Site Assessment Intact;Dry 08/27/2018  8:00 PM  Date Prophylactic Dressing Applied (if applicable) 09/10/2018 08/27/2018  8:00 AM  Ongoing Placement Verification No change in cm markings or external length of tube from initial placement;No change in respiratory status;No acute changes, not attributed to clinical condition 08/27/2018  8:00 PM  Status Suction-low intermittent 08/27/2018  8:00 PM  Amount of suction 80 mmHg 08/27/2018  8:00 PM  Drainage Appearance Brown;Clear 08/27/2018  8:00 PM  Output (mL) 50 mL 08/27/2018  6:08 PM     Urethral Catheter Marylene LandAngela, Rn (Active)  Indication for Insertion or Continuance of Catheter Unstable critical patients (first 24-48 hours) 08/27/2018  8:00 PM  Site Assessment Clean;Intact 08/27/2018  8:00 PM  Catheter Maintenance Seal intact;No dependent loops;Insertion date on drainage bag;Drainage bag/tubing not touching floor;Catheter  secured;Bag below level of bladder 08/27/2018  8:00 PM  Collection Container Standard drainage bag 08/27/2018  8:00 PM  Securement Method Securing device (Describe) 08/27/2018  8:00 PM  Urinary Catheter Interventions Unclamped 08/27/2018  8:00 PM  Output (mL) 150 mL 08/27/2018  6:08 PM    Anti-infectives:  Anti-infectives (From admission, onward)   Start     Dose/Rate Route Frequency Ordered Stop   08/27/18 1600  vancomycin (VANCOCIN) IVPB 750 mg/150 ml premix  Status:  Discontinued     750 mg 150 mL/hr over 60 Minutes Intravenous Every 48 hours 09/11/2018 1527 08/30/2018 1636   08/27/18 1600  vancomycin (VANCOCIN) IVPB 750 mg/150 ml premix  Status:  Discontinued     750 mg 150 mL/hr over 60 Minutes Intravenous Every 48 hours 08/26/18 0523 08/26/18 1108   08/26/18 1800  cefTRIAXone (ROCEPHIN) 1 g in sodium chloride 0.9 % 100 mL IVPB     1 g 200 mL/hr over 30 Minutes Intravenous Every 24 hours 08/26/18 1108     08/26/18 1600  cefTRIAXone (ROCEPHIN) 1 g in sodium chloride 0.9 % 100 mL IVPB  Status:  Discontinued     1 g 200 mL/hr over 30 Minutes Intravenous Every 24 hours 08/31/2018 1636 08/26/18 0523   08/26/18 1600  ceFEPIme (MAXIPIME) 1 g in sodium chloride 0.9 % 100 mL IVPB  Status:  Discontinued     1 g 200 mL/hr over 30 Minutes Intravenous Every 24 hours 08/26/18 0523 08/26/18 1108   08/26/18 1500  azithromycin (ZITHROMAX) 500 mg in sodium chloride 0.9 % 250 mL IVPB  Status:  Discontinued  500 mg 250 mL/hr over 60 Minutes Intravenous Every 24 hours 08/18/2018 1636 08/28/18 1109   08/24/2018 1600  ceFEPIme (MAXIPIME) 1 g in sodium chloride 0.9 % 100 mL IVPB  Status:  Discontinued     1 g 200 mL/hr over 30 Minutes Intravenous Every 24 hours 08/28/2018 1527 09/06/2018 1636   09/09/2018 1600  vancomycin (VANCOCIN) 1,500 mg in sodium chloride 0.9 % 500 mL IVPB  Status:  Discontinued     1,500 mg 250 mL/hr over 120 Minutes Intravenous  Once 08/27/2018 1527 09/08/2018 1636   08/17/2018 1445  cefTRIAXone  (ROCEPHIN) 1 g in sodium chloride 0.9 % 100 mL IVPB     1 g 200 mL/hr over 30 Minutes Intravenous  Once 09/16/2018 1434 09/16/2018 1452   08/18/2018 1445  azithromycin (ZITHROMAX) 500 mg in sodium chloride 0.9 % 250 mL IVPB     500 mg 250 mL/hr over 60 Minutes Intravenous  Once 08/20/2018 1434 09/13/2018 1600      Microbiology: Results for orders placed or performed during the hospital encounter of 09/10/2018  MRSA PCR Screening     Status: None   Collection Time: 08/24/2018  5:11 PM  Result Value Ref Range Status   MRSA by PCR NEGATIVE NEGATIVE Final    Comment:        The GeneXpert MRSA Assay (FDA approved for NASAL specimens only), is one component of a comprehensive MRSA colonization surveillance program. It is not intended to diagnose MRSA infection nor to guide or monitor treatment for MRSA infections. Performed at [facility], [address] [place], [place] [address]     Best Practice/Protocols:  VTE Prophylaxis: Mechanical GI Prophylaxis: Proton Pump Inhibitor Vent Management  Events: 25 August 2018 admitted to ICU intubated on mechanical ventilation post cardiac arrest (multiple times)  Studies: Ct Head Wo Contrast  Result Date: 08/28/2018 CLINICAL DATA:  Patient presented to the emergency department for cardiac arrest. Given intravenous contrast earlier today. She also complained of difficulty breathing earlier today and went to the urgent care with shortness of breath and cough. Patient had a syncopal event while at the lap, subsequently found to have no pulse. CPR begun immediately and EMS called. EXAM: CT HEAD WITHOUT CONTRAST TECHNIQUE: Contiguous axial images were obtained from the base of the skull through the vertex without intravenous contrast. COMPARISON:  None. FINDINGS: Brain: No evidence of acute infarction, hemorrhage, hydrocephalus, extra-axial collection or mass lesion/mass effect. There is mild ventricular and sulcal enlargement reflecting  age-appropriate volume loss. There are mild areas of white matter hypoattenuation consistent with chronic microvascular ischemic change. Vascular: No hyperdense vessel or unexpected calcification. Skull: Normal. Negative for fracture or focal lesion. Sinuses/Orbits: Visualized globes and orbits are unremarkable. The visualized sinuses and mastoid air cells are clear. Other: None. IMPRESSION: 1. No acute intracranial abnormalities. 2. Age-appropriate volume loss and mild chronic microvascular ischemic change. Electronically Signed   By: Amie Portland M.D.   On: 08/18/2018 15:54   Ct Angio Chest Pe W And/or Wo Contrast  Result Date: 08/28/2018 CLINICAL DATA:  C/o SOB and cough, followed by cardiac arrest EXAM: CT ANGIOGRAPHY CHEST WITH CONTRAST TECHNIQUE: Multidetector CT imaging of the chest was performed using the standard protocol during bolus administration of intravenous contrast. Multiplanar CT image reconstructions and MIPs were obtained to evaluate the vascular anatomy. CONTRAST:  24mL OMNIPAQUE IOHEXOL 350 MG/ML SOLN COMPARISON:  Current chest radiograph. FINDINGS: Cardiovascular: There is satisfactory opacification of the pulmonary arteries to the segmental level. There is no  evidence of a pulmonary embolism. Heart is mildly enlarged. There are three-vessel coronary artery calcifications. No pericardial effusion. Great vessels are normal in caliber. No aortic dissection minimal aortic atherosclerosis. Mediastinum/Nodes: No neck base, axillary, mediastinal or hilar masses or pathologically enlarged lymph nodes. Trachea is patent. Endotracheal tube tip projects 13 mm above the Carina. Nasal/orogastric tube passes below the diaphragm well into the stomach. Lungs/Pleura: Small, right greater than left, pleural effusions. There is dependent airspace opacity in both lungs, right greater than left. At least a component of this is presumably atelectasis. There is additional patchy peribronchovascular type  airspace consolidation, both confluent and ground-glass, and both lungs, greater on the right, as well as bilateral interstitial thickening that is most evident in the upper lobes. No pneumothorax. Upper Abdomen: No acute abnormality. Musculoskeletal: No fracture or acute finding. No osteoblastic or osteolytic lesions. Review of the MIP images confirms the above findings. IMPRESSION: 1. No evidence of a pulmonary embolism. 2. Dependent bilateral lung consolidation/atelectasis. Atelectasis is presumably a component of the dependent opacity, but there are findings also consistent with pneumonia, particularly on the right. In addition, there are patchy areas of confluent and ground-glass opacities as well as interstitial thickening and small bilateral pleural effusions that are consistent with pulmonary edema. 3. Mild cardiomegaly and three-vessel coronary artery calcifications. Minor aortic atherosclerosis. 4. Support apparatus is well positioned. Aortic Atherosclerosis (ICD10-I70.0). Electronically Signed   By: Amie Portland M.D.   On: 08/31/2018 14:39   Dg Chest Port 1 View  Result Date: 08/28/2018 CLINICAL DATA:  Shortness of breath, endotracheal tube placement. EXAM: PORTABLE CHEST 1 VIEW COMPARISON:  Radiograph August 28, 2018. FINDINGS: Stable cardiomediastinal silhouette. Endotracheal tube is in grossly good position. Distal tip of nasogastric tube remains within the mid esophagus. No pneumothorax is noted. Stable bibasilar atelectasis is noted with probable minimal pleural effusions. Bony thorax is unremarkable. IMPRESSION: Endotracheal tube in grossly good position. Distal tip of nasogastric tube remains within the mid esophagus and advancement is recommended. Stable bibasilar opacities as described above. Electronically Signed   By: Lupita Raider, M.D.   On: 08/28/2018 07:08   Dg Chest Port 1 View  Result Date: 08/28/2018 CLINICAL DATA:  Acute respiratory failure EXAM: PORTABLE CHEST 1 VIEW  COMPARISON:  Chest radiograph 08/27/2018 FINDINGS: ET tube mid trachea. Interval retraction enteric tube with tip terminating the midesophagus. Monitoring leads overlie the patient. Stable cardiomegaly. Minimal heterogeneous opacities right lung base. No pleural effusion or pneumothorax. IMPRESSION: Interval retraction enteric tube now terminating in the mid esophagus. Recommend advancement. Heterogeneous opacities right lung base favored to represent atelectasis. Aspiration not excluded. These results will be called to the ordering clinician or representative by the Radiologist Assistant, and communication documented in the PACS or zVision Dashboard. Electronically Signed   By: Annia Belt M.D.   On: 08/28/2018 06:29   Dg Chest Port 1 View  Result Date: 08/27/2018 CLINICAL DATA:  Acute respiratory failure. EXAM: PORTABLE CHEST 1 VIEW COMPARISON:  One-view chest x-ray 08/26/2018 FINDINGS: Heart size is exaggerated by low lung volumes. Endotracheal tube is satisfactory in position, 3.5 cm above the carina. NG tube courses off the inferior border of the film. New bibasilar airspace disease is present, left greater than right. A left pleural effusion is suspected. Mild pulmonary vascular congestion is stable. IMPRESSION: 1. New left greater than right basilar airspace disease. While this may represent atelectasis, it is concerning for infection or possibly aspiration. 2. Left pleural effusion is suspected. 3. Mild pulmonary vascular congestion.  4. Support apparatus is stable. Electronically Signed   By: Marin Roberts M.D.   On: 08/27/2018 08:12   Dg Chest Port 1 View  Result Date: 08/26/2018 CLINICAL DATA:  Acute respiratory failure. EXAM: PORTABLE CHEST 1 VIEW COMPARISON:  Chest radiograph and CTA Sep 03, 2018 FINDINGS: Endotracheal tube appears to have been withdrawn since the prior radiograph and now terminates 3 cm above the carina. Enteric tube courses into the left upper abdomen with tip not imaged.  Airspace opacities on the prior radiograph have substantially improved. Residual opacities are most notable in the right lower lobe and left upper lobe. There are likely persistent small bilateral pleural effusions. No pneumothorax is identified. IMPRESSION: Interval improvement of bilateral airspace disease. Electronically Signed   By: Sebastian Ache M.D.   On: 08/26/2018 11:38   Dg Chest Portable 1 View  Result Date: 09/03/2018 CLINICAL DATA:  Intubation. EXAM: PORTABLE CHEST 1 VIEW COMPARISON:  Chest x-ray 10/22/2012. FINDINGS: Endotracheal tube tip noted at the level of the carina, proximal repositioning of approximately 4 cm suggested. NG tube noted with tip below left hemidiaphragm. Cardiomegaly. Diffuse bilateral pulmonary infiltrates/edema. Cardiac B-lines noted. Findings most consistent with congestive heart failure with bilateral pulmonary edema. IMPRESSION: 1. Endotracheal tube tip noted just above the carina. Proximal repositioning of approximately 4 cm suggested. NG tube noted with tip below left hemidiaphragm. 2. Findings consistent with congestive heart failure with bilateral pulmonary edema. Critical Value/emergent results were called by telephone at the time of interpretation on 03-Sep-2018 at 1:48 pm to Dr. Wyline Mood, who verbally acknowledged these results. Electronically Signed   By: Maisie Fus  Register   On: 09/03/18 13:52   [place] Ekg Site Rite  Result Date: 08/27/2018 If Site Rite image not attached, placement could not be confirmed due to current cardiac rhythm.   Consults: Treatment Team:  Pccm, Raymond Gurney, MD Dalia Heading, MD   Subjective:    Overnight Issues:  Overnight no overt issues.  She does have a good leak test today.  She continues to be intermittently agitated and intermittently follows one-step commands.  Tongue appears less swollen.  She is exhibiting periodic breathing/Cheyne-Stokes likely due to encephalopathy and cardiomyopathy. Objective:  Vital signs for last  24 hours: Temp:  [98.6 F (37 C)-99.7 F (37.6 C)] 98.6 F (37 C) (01/12 2000) Pulse Rate:  [54-98] 82 (01/12 2100) Resp:  [8-38] 12 (01/12 2100) BP: (111-149)/(48-87) 117/57 (01/12 2100) SpO2:  [86 %-100 %] 98 % (01/12 2100) FiO2 (%):  [35 %-40 %] 40 % (01/12 1531) Weight:  [68.1 kg] 68.1 kg (01/12 0500)  Hemodynamic parameters for last 24 hours:    Intake/Output from previous day: 01/11 0701 - 01/12 0700 In: 4367.7 [I.V.:3027.7; Blood:640; IV Piggyback:700] Out: 1295 [Urine:1195; Emesis/NG output:100]  Intake/Output this shift: No intake/output data recorded.  Vent settings for last 24 hours: Vent Mode: PSV FiO2 (%):  [35 %-40 %] 40 % Set Rate:  [16 bmp] 16 bmp Vt Set:  [500 mL] 500 mL PEEP:  [2 cmH20-5 cmH20] 5 cmH20 Pressure Support:  [10 cmH20] 10 cmH20 Plateau Pressure:  [21 cmH20] 21 cmH20  Physical Exam:  General:  ill appearing elderly female, orotracheally intubated Neuro: Lightly sedated, appears frustrated and agitated at times, follows simple commands although at times, PERRL HEENT:  tongue edema decreased significantly with dried blood ,  Neck: + JVD  Cardiovascular: sinus rhythm, no R/G, LBBB on monitor Lungs:  Rhonchi throughout, even, non labored  Abdomen: +BS x4, soft, non distended  Musculoskeletal: normal bulk  and tone, no edema Skin: intact no rashes or lesions present   Leak noted when ETT cuff deflated. Assessment/Plan:  Acute hypoxic hypercapnic respiratory failure secondary to cardiac arrest in the setting of pneumonia versus URI.  She has significant volume overload today Mechanical ventilation  Proceeded to give a trial to extubation after diuresis. Did well initially with high flow O2 however had to be placed on BiPAP due to desaturations. Diuresis with Lasix Family made aware that she may need reintubation however this will likely mean prolonged ventilatory support given and cardiac dysfunction, encephalopathy and renal  dysfunction.  Recurrent cardiac arrest (PEA/Ventricular Tachycardia) Cardiogenic shock  Elevated troponin secondary to demand ischemia vs. NSTEMI Hx: HTN Continuous telemetry monitoring Trend troponin's Cardiology on board Judicious use of beta-blocker to decrease catecholamines Hold outpatient antihypertensives 2D echo confirms 30 to 35% EF.  Acute on chronic renal failure Trend BMP  Replace electrolytes as indicated Monitor UOP Avoid nephrotoxic medications  Required Lasix today.  Transaminitis secondary to septic and cardiogenic shock- shock liver Trend hepatic panel  Anemia with acute blood loss s/p TPA treated empirically for possible pulmonary embolism (CT negative for PE 08/25/18) Trend CBC Monitor for s/sx of bleeding and transfuse for hgb <7 transfused yesterday    Type II Diabetes Mellitus CBG's q4hrs SSI   Acute encephalopathy concerning for possible anoxic injury secondary to recurrent cardiac arrest  Continue supportive care.  May need repeat CT head.   LOS: 3 days   Additional comments:Discussed with family at bedside.  Critical Care Total Time*: 45 Minutes  C. Danice Goltz, MD 08/28/2018  *Care during the described time interval was provided by me and/or other providers on the critical care team.  I have reviewed this patient's available data, including medical history, events of note, physical examination and test results as part of my evaluation.

## 2018-08-28 NOTE — Progress Notes (Signed)
Patient ID: Laura [name], female   DOB: 1928-01-12, 85 y.o.   MRN: [id number]  [employer] [facility] PROGRESS NOTE  Breyon Blass FAO:[id number] DOB: Jan 21, 1928 DOA: 09/03/2018 PCP: Juline Patch, MD  HPI/Subjective: Patient had an arrest yesterday.  Currently on the ventilator.  Received TPA by ER physician.  Could not tolerate trial of weaning sedation yesterday. Plan for blood transfusion and trial of weaning again today.  Objective: Vitals:   08/28/18 0600 08/28/18 0700  BP: 128/65 (!) 147/58  Pulse: (!) 56 88  Resp: 16 16  Temp: 99 F (37.2 C) 98.6 F (37 C)  SpO2: 100% 100%    Filed Weights   08/26/18 0439 08/27/18 0415 08/28/18 0500  Weight: 61.9 kg 66.5 kg 68.1 kg    ROS: Review of Systems  Unable to perform ROS: Acuity of condition   Exam: Physical Exam  HENT:  Nose: No mucosal edema.  Some tongue swelling and blood around the tongue.  Eyes: Pupils are equal, round, and reactive to light. Conjunctivae and lids are normal.  Neck: No JVD present. Carotid bruit is not present. No edema present. No thyroid mass and no thyromegaly present.  Cardiovascular: S1 normal and S2 normal. Exam reveals no gallop.  No murmur heard. Pulses:      Dorsalis pedis pulses are 2+ on the right side and 2+ on the left side.  Respiratory: No respiratory distress. She has decreased breath sounds in the right lower field and the left lower field. She has no wheezes. She has no rhonchi. She has no rales.  GI: Soft. Bowel sounds are normal. There is no abdominal tenderness.  Musculoskeletal:     Right ankle: She exhibits no swelling.     Left ankle: She exhibits no swelling.  Lymphadenopathy:    She has no cervical adenopathy.  Neurological:  Sedated on ventilator.  Skin: Skin is warm. No rash noted. Nails show no clubbing.  Psychiatric:  Sedated on ventilator      Data Reviewed: Basic Metabolic Panel: Recent Labs  Lab 08/31/2018 1311  09/05/2018 1351  09/12/2018 1619 09/05/2018 1626 08/26/18 0036 08/27/18 0447 08/28/18 0616  NA 141   < > 136 135  --  137 141 139  K 6.2*   < > 3.8 3.8  --  3.8 3.6 3.3*  CL 102   < > 104 105  --  104 109 110  CO2 19*  --   --  17*  --  21* 21* 21*  GLUCOSE 286*   < > 373* 268*  --  230* 159* 155*  BUN 29*   < > 24* 31*  --  32* 31* 41*  CREATININE 2.03*   < > 1.80* 1.88* 1.89* 1.85* 1.93* 2.32*  CALCIUM 14.8*  --   --  9.4  --  9.2 8.4* 8.2*  MG  --   --   --   --   --  1.6*  --  2.0  PHOS  --   --   --   --   --   --   --  3.1   < > = values in this interval not displayed.   Liver Function Tests: Recent Labs  Lab 08/19/2018 1311 08/26/18 0036 08/28/18 0616  AST 1,308* 3,875* 439*  ALT 1,211* 2,263* 960*  ALKPHOS 50 60 54  BILITOT 0.6 0.9 0.7  PROT 6.2* 5.8* 5.8*  ALBUMIN 2.8* 2.7* 2.9*   CBC: Recent Labs  Lab 08/20/2018 1311  08/31/2018  1450 09/09/2018 1621 08/26/18 0036 08/27/18 0447 08/27/18 1709 08/28/18 0616  WBC 11.1*  --  12.0* 14.0* 14.1* 10.3  --  14.6*  NEUTROABS 3.8  --   --   --   --  8.7*  --   --   HGB 8.7*   < > 8.2* 8.7* 8.8* 6.7* 8.5* 8.0*  HCT 29.4*   < > 26.8* 28.6* 28.0* 21.2* 27.5* 25.4*  MCV 94.5  --  92.4 92.3 90.6 89.8  --  88.5  PLT 172  --  158 171 177 128*  --  127*   < > = values in this interval not displayed.   Cardiac Enzymes: Recent Labs  Lab 09/12/2018 1311 08/30/2018 1626 08/21/2018 2041 08/26/18 0457  TROPONINI 0.04* 0.30* 0.50* 0.37*    CBG: Recent Labs  Lab 08/27/18 1116 08/27/18 1624 08/27/18 1926 08/27/18 2321 08/28/18 0328  GLUCAP 186* 162* 166* 161* 162*    Recent Results (from the past 240 hour(s))  MRSA PCR Screening     Status: None   Collection Time: 09/06/2018  5:11 PM  Result Value Ref Range Status   MRSA by PCR NEGATIVE NEGATIVE Final    Comment:        The GeneXpert MRSA Assay (FDA approved for NASAL specimens only), is one component of a comprehensive MRSA colonization surveillance program. It is not intended to diagnose  MRSA infection nor to guide or monitor treatment for MRSA infections. Performed at [facility], [address], [place], [place] [address]      Studies: Dg Chest [facility] 1 View  Result Date: 08/28/2018 CLINICAL DATA:  Shortness of breath, endotracheal tube placement. EXAM: PORTABLE CHEST 1 VIEW COMPARISON:  Radiograph August 28, 2018. FINDINGS: Stable cardiomediastinal silhouette. Endotracheal tube is in grossly good position. Distal tip of nasogastric tube remains within the mid esophagus. No pneumothorax is noted. Stable bibasilar atelectasis is noted with probable minimal pleural effusions. Bony thorax is unremarkable. IMPRESSION: Endotracheal tube in grossly good position. Distal tip of nasogastric tube remains within the mid esophagus and advancement is recommended. Stable bibasilar opacities as described above. Electronically Signed   By: Marijo Conception, M.D.   On: 08/28/2018 07:08   Dg Chest Port 1 View  Result Date: 08/28/2018 CLINICAL DATA:  Acute respiratory failure EXAM: PORTABLE CHEST 1 VIEW COMPARISON:  Chest radiograph 08/27/2018 FINDINGS: ET tube mid trachea. Interval retraction enteric tube with tip terminating the midesophagus. Monitoring leads overlie the patient. Stable cardiomegaly. Minimal heterogeneous opacities right lung base. No pleural effusion or pneumothorax. IMPRESSION: Interval retraction enteric tube now terminating in the mid esophagus. Recommend advancement. Heterogeneous opacities right lung base favored to represent atelectasis. Aspiration not excluded. These results will be called to the ordering clinician or representative by the Radiologist Assistant, and communication documented in the PACS or zVision Dashboard. Electronically Signed   By: Lovey Newcomer M.D.   On: 08/28/2018 06:29   Dg Chest Port 1 View  Result Date: 08/27/2018 CLINICAL DATA:  Acute respiratory failure. EXAM: PORTABLE CHEST 1 VIEW COMPARISON:  One-view chest x-ray 08/26/2018 FINDINGS:  Heart size is exaggerated by low lung volumes. Endotracheal tube is satisfactory in position, 3.5 cm above the carina. NG tube courses off the inferior border of the film. New bibasilar airspace disease is present, left greater than right. A left pleural effusion is suspected. Mild pulmonary vascular congestion is stable. IMPRESSION: 1. New left greater than right basilar airspace disease. While this may represent atelectasis, it is concerning for  infection or possibly aspiration. 2. Left pleural effusion is suspected. 3. Mild pulmonary vascular congestion. 4. Support apparatus is stable. Electronically Signed   By: San Morelle M.D.   On: 08/27/2018 08:12   Dg Chest Port 1 View  Result Date: 08/26/2018 CLINICAL DATA:  Acute respiratory failure. EXAM: PORTABLE CHEST 1 VIEW COMPARISON:  Chest radiograph and CTA 08/22/2018 FINDINGS: Endotracheal tube appears to have been withdrawn since the prior radiograph and now terminates 3 cm above the carina. Enteric tube courses into the left upper abdomen with tip not imaged. Airspace opacities on the prior radiograph have substantially improved. Residual opacities are most notable in the right lower lobe and left upper lobe. There are likely persistent small bilateral pleural effusions. No pneumothorax is identified. IMPRESSION: Interval improvement of bilateral airspace disease. Electronically Signed   By: Logan Bores M.D.   On: 08/26/2018 11:38   [place] Ekg Site Rite  Result Date: 08/27/2018 If Site Rite image not attached, placement could not be confirmed due to current cardiac rhythm.   Scheduled Meds: . sodium chloride   Intravenous Once  . chlorhexidine gluconate (MEDLINE KIT)  15 mL Mouth Rinse BID  . fentaNYL (SUBLIMAZE) injection  50 mcg Intravenous Once  . insulin aspart  0-9 Units Subcutaneous Q4H  . mouth rinse  15 mL Mouth Rinse 10 times per day  . pantoprazole (PROTONIX) IV  40 mg Intravenous Q12H   Continuous Infusions: . sodium  chloride 100 mL/hr at 08/28/18 0600  . azithromycin 500 mg (08/27/18 1500)  . cefTRIAXone (ROCEPHIN)  IV Stopped (08/27/18 1835)  . dexmedetomidine (PRECEDEX) IV infusion Stopped (08/27/18 1452)  . DOPamine Stopped (08/28/2018 1600)  . fentaNYL infusion INTRAVENOUS 225 mcg/hr (08/28/18 0600)  . norepinephrine (LEVOPHED) Adult infusion Stopped (08/26/18 1228)  . propofol (DIPRIVAN) infusion Stopped (08/26/18 0845)    Assessment/Plan:  1. Cardiac arrest.  Supportive care.  Currently on the ventilator.         Pulmonary following to manage ventilator.  Patient received TPA by ER        For suspected PE- ruled out by CT angio.     Seen by cardiology- Plan for cath- if and when pt stable and improve renal func. 2. Pneumonia on CT scan.  On Rocephin and Zithromax.  Patient on stress dose steroids 3. Shock liver.  Continue to monitor liver function test 4. Acute kidney injury on chronic kidney disease.  Continue to monitor kidney function 5. Anemia with bleeding.  Continue to watch hemoglobin. Need transfusion today.  Code Status:     Code Status Orders  (From admission, onward)         Start     Ordered   09/11/2018 1626  Full code  Continuous     09/07/2018 1625        Code Status History    This patient has a current code status but no historical code status.     Family Communication: Family at bedside Disposition Plan: To be determined  Consultants:  Critical care specialist  Cardiology  Antibiotics:  Rocephin  Zithromax  Time spent: 35 minutes  [employer]

## 2018-08-28 NOTE — Progress Notes (Signed)
Spoke with niece re PICC line, consent obtained.  Erskine Emery, niece states that pt sees a nephrologist who has d/w pt the potential for dialysis in the future.  Notified Aleksey RN that nephrology is required to approve PICC placement prior to VAS Team placement.  Aleksey RN is to notify ICU MD of this requirement.  VAS Team will proceed once nephrology note/order has been placed in chart.

## 2018-08-28 NOTE — Progress Notes (Signed)
Patent is on AVAPS mode with a set VT of 480, Max pressure 26 and Max E pressure of 8

## 2018-08-28 NOTE — Progress Notes (Signed)
Patient remains on BiPap at this time.  Patient needing redirection. Multiple attempts at pulling off BiPap.  No acute distress noted.  Will continue to monitor.  Report given to oncoming nurse.

## 2018-08-28 NOTE — Progress Notes (Signed)
Spoke with Tracie Harrier re nephrology approval for PICC or midline.  RN states plan to extubate pt today and no longer needs the midline at this time. States ok to cancel midline request.

## 2018-08-29 ENCOUNTER — Telehealth: Payer: Self-pay | Admitting: Internal Medicine

## 2018-08-29 LAB — CBC
HCT: 30.3 % — ABNORMAL LOW (ref 36.0–46.0)
Hemoglobin: 9.2 g/dL — ABNORMAL LOW (ref 12.0–15.0)
MCH: 28.6 pg (ref 26.0–34.0)
MCHC: 30.4 g/dL (ref 30.0–36.0)
MCV: 94.1 fL (ref 80.0–100.0)
Platelets: 163 10*3/uL (ref 150–400)
RBC: 3.22 MIL/uL — AB (ref 3.87–5.11)
RDW: 16.4 % — ABNORMAL HIGH (ref 11.5–15.5)
WBC: 23.4 10*3/uL — ABNORMAL HIGH (ref 4.0–10.5)
nRBC: 5.9 % — ABNORMAL HIGH (ref 0.0–0.2)

## 2018-08-29 LAB — BLOOD GAS, ARTERIAL
Acid-base deficit: 9.5 mmol/L — ABNORMAL HIGH (ref 0.0–2.0)
Bicarbonate: 20.1 mmol/L (ref 20.0–28.0)
DELIVERY SYSTEMS: POSITIVE
Expiratory PAP: 8
FIO2: 0.6
Inspiratory PAP: 26
MECHVT: 480 mL
O2 Saturation: 79.5 %
Patient temperature: 37
RATE: 10 resp/min
pCO2 arterial: 59 mmHg — ABNORMAL HIGH (ref 32.0–48.0)
pH, Arterial: 7.14 — CL (ref 7.350–7.450)
pO2, Arterial: 58 mmHg — ABNORMAL LOW (ref 83.0–108.0)

## 2018-08-29 LAB — BPAM RBC
Blood Product Expiration Date: 202002102359
Blood Product Expiration Date: 202002112359
ISSUE DATE / TIME: 202001111139
Unit Type and Rh: 5100
Unit Type and Rh: 5100

## 2018-08-29 LAB — COMPREHENSIVE METABOLIC PANEL
ALT: 834 U/L — ABNORMAL HIGH (ref 0–44)
AST: 267 U/L — ABNORMAL HIGH (ref 15–41)
Albumin: 3.2 g/dL — ABNORMAL LOW (ref 3.5–5.0)
Alkaline Phosphatase: 62 U/L (ref 38–126)
Anion gap: 7 (ref 5–15)
BILIRUBIN TOTAL: 0.7 mg/dL (ref 0.3–1.2)
BUN: 48 mg/dL — ABNORMAL HIGH (ref 8–23)
CO2: 23 mmol/L (ref 22–32)
Calcium: 8.3 mg/dL — ABNORMAL LOW (ref 8.9–10.3)
Chloride: 114 mmol/L — ABNORMAL HIGH (ref 98–111)
Creatinine, Ser: 2.68 mg/dL — ABNORMAL HIGH (ref 0.44–1.00)
GFR calc Af Amer: 18 mL/min — ABNORMAL LOW (ref >60)
GFR calc non Af Amer: 15 mL/min — ABNORMAL LOW (ref >60)
Glucose, Bld: 124 mg/dL — ABNORMAL HIGH (ref 70–99)
Potassium: 4.5 mmol/L (ref 3.5–5.1)
Sodium: 144 mmol/L (ref 135–145)
TOTAL PROTEIN: 6.4 g/dL — AB (ref 6.5–8.1)

## 2018-08-29 LAB — TYPE AND SCREEN
ABO/RH(D): O POS
Antibody Screen: NEGATIVE
Unit division: 0
Unit division: 0

## 2018-08-29 LAB — LACTIC ACID, PLASMA: Lactic Acid, Venous: 2.1 mmol/L (ref 0.5–1.9)

## 2018-08-29 LAB — PREPARE RBC (CROSSMATCH)

## 2018-08-29 LAB — PATHOLOGIST SMEAR REVIEW

## 2018-08-29 LAB — PHOSPHORUS: Phosphorus: 6.7 mg/dL — ABNORMAL HIGH (ref 2.5–4.6)

## 2018-08-29 LAB — TROPONIN I: Troponin I: 3.89 ng/mL (ref <0.03)

## 2018-08-29 LAB — MAGNESIUM: Magnesium: 2.1 mg/dL (ref 1.7–2.4)

## 2018-08-29 LAB — AMMONIA: Ammonia: 34 umol/L (ref 9–35)

## 2018-08-29 MED ORDER — ETOMIDATE 2 MG/ML IV SOLN: 20.0000 mg | Freq: Once | INTRAVENOUS | Status: DC

## 2018-08-29 MED ORDER — GLYCOPYRROLATE 0.2 MG/ML IJ SOLN
0.2000 mg | INTRAMUSCULAR | Status: DC | PRN
  Filled 2018-08-29: qty 1

## 2018-08-29 MED ORDER — SODIUM BICARBONATE 8.4 % IV SOLN
100.0000 meq | Freq: Once | INTRAVENOUS | Status: AC
  Administered 2018-08-29: 100 meq via INTRAVENOUS

## 2018-08-29 MED ORDER — MORPHINE 100MG IN NS 100ML (1MG/ML) PREMIX INFUSION
1.0000 mg/h | INTRAVENOUS | Status: DC
  Administered 2018-08-29: 2 mg/h via INTRAVENOUS
  Filled 2018-08-29: qty 100

## 2018-08-29 MED ORDER — MIDAZOLAM HCL 2 MG/2ML IJ SOLN: 4.0000 mg | Freq: Once | INTRAMUSCULAR | Status: DC

## 2018-08-29 MED ORDER — MORPHINE SULFATE (PF) 2 MG/ML IV SOLN
INTRAVENOUS | Status: AC
  Administered 2018-08-29: 1 mg via INTRAVENOUS
  Filled 2018-08-29: qty 1

## 2018-08-29 MED ORDER — MORPHINE SULFATE (PF) 2 MG/ML IV SOLN
1.0000 mg | Freq: Once | INTRAVENOUS | Status: AC
  Administered 2018-08-29: 1 mg via INTRAVENOUS

## 2018-08-29 MED ORDER — FENTANYL CITRATE (PF) 100 MCG/2ML IJ SOLN: 200.0000 ug | Freq: Once | INTRAMUSCULAR | Status: DC

## 2018-08-29 MED ORDER — LORAZEPAM 2 MG/ML IJ SOLN: 1.0000 mg | INTRAMUSCULAR | Status: DC | PRN

## 2018-09-17 NOTE — Telephone Encounter (Signed)
Death certificate received, placed in DK box for signing.

## 2018-09-17 NOTE — Progress Notes (Signed)
Pt taken off BIPAP and placed on HFNC 100% per MD request.

## 2018-09-17 NOTE — Telephone Encounter (Signed)
Death Certificate complete, [employer] aware. Placed at front desk for pickup.

## 2018-09-17 NOTE — Progress Notes (Signed)
23: 50: Patient extubated today but required BiPAP shortly after extubation. This evening, patient became severely lethargic. STAT ABG reveal a pH of 7.14, PCO2 59, PO2 58 and Bicarb of 20. Rest of the STAT labs pending. Family notified regarding need for re-intubation. Spoke with niece Erskine Emery who indicated she will be coming in. Dr. Jayme Cloud notified.  Currently setting up for intubation while awaiting family's decision.  Of note, Patient is s/p cardiac arrest X 4 on 08/21/2018, with respiratory failure, pneumonia, septic shock, cardiogenic shock and CKD stage 3. Received tPA for presumed PE; CTA chest negative 0005: Dr. Jayme Cloud arrived and evaluated patient.  Reviewed labs and spoke at length with family regarding prognosis and goals of care.  Family decided to proceed with full comfort care.  End of life care orders placed and patient started on morphine.  Family at bedside with chaplain.  Support provided to the family.  Magdalene S. [facility] ANP-BC Pulmonary and Critical Care Medicine [facility] Pager [phone] or [phone]  NB: This document was prepared using Dragon voice recognition software and may include unintentional dictation errors.

## 2018-09-17 NOTE — Progress Notes (Signed)
   08/28/2018 0200  Clinical Encounter Type  Visited With Patient;Patient and family together  Visit Type Initial;Follow-up;Spiritual support;Patient actively dying  Referral From Nurse;Physician  Spiritual Encounters  Spiritual Needs Prayer;Emotional;Grief support;Other (Comment)

## 2018-09-17 NOTE — Death Summary Note (Signed)
DEATH SUMMARY   Patient Details  Name: Laura [name] MRN: [id number] DOB: 1928/08/06  Admission/Discharge Information   Admit Date:  08/18/2018  Date of Death: Date of Death: 08/24/2018  Time of Death: Time of Death: 0700  Length of Stay: 4  Referring Physician: Duanne LimerickJones, Deanna C, MD   Reason(s) for Hospitalization  Pneumonia resp failure   Diagnoses  Preliminary cause of death: pneumonia,cardiac arrest, renal failure Secondary Diagnoses (including complications and co-morbidities):  Active Problems:   Cardiac arrest [facility](HCC)   Brief Hospital Course (including significant findings, care, treatment, and services provided and events leading to death)  Admitted for cardiac arrest from pneumonia Patient on vent and vasopressors Cardiogenic shock NSTEMI Liver failure Anemia Anoxic brain injury   Family decided on DNR status and Comfort Care measures  Pertinent Labs and Studies  Significant Diagnostic Studies Ct Head Wo Contrast  Result Date: 09/09/2018 CLINICAL DATA:  Patient presented to the emergency department for cardiac arrest. Given intravenous contrast earlier today. She also complained of difficulty breathing earlier today and went to the urgent care with shortness of breath and cough. Patient had a syncopal event while at the lap, subsequently found to have no pulse. CPR begun immediately and EMS called. EXAM: CT HEAD WITHOUT CONTRAST TECHNIQUE: Contiguous axial images were obtained from the base of the skull through the vertex without intravenous contrast. COMPARISON:  None. FINDINGS: Brain: No evidence of acute infarction, hemorrhage, hydrocephalus, extra-axial collection or mass lesion/mass effect. There is mild ventricular and sulcal enlargement reflecting age-appropriate volume loss. There are mild areas of white matter hypoattenuation consistent with chronic microvascular ischemic change. Vascular: No hyperdense vessel or unexpected calcification. Skull: Normal.  Negative for fracture or focal lesion. Sinuses/Orbits: Visualized globes and orbits are unremarkable. The visualized sinuses and mastoid air cells are clear. Other: None. IMPRESSION: 1. No acute intracranial abnormalities. 2. Age-appropriate volume loss and mild chronic microvascular ischemic change. Electronically Signed   By: Amie Portlandavid  Ormond M.D.   On: 01-10-2019 15:54   Ct Angio Chest Pe W And/or Wo Contrast  Result Date: 09/12/2018 CLINICAL DATA:  C/o SOB and cough, followed by cardiac arrest EXAM: CT ANGIOGRAPHY CHEST WITH CONTRAST TECHNIQUE: Multidetector CT imaging of the chest was performed using the standard protocol during bolus administration of intravenous contrast. Multiplanar CT image reconstructions and MIPs were obtained to evaluate the vascular anatomy. CONTRAST:  60mL OMNIPAQUE IOHEXOL 350 MG/ML SOLN COMPARISON:  Current chest radiograph. FINDINGS: Cardiovascular: There is satisfactory opacification of the pulmonary arteries to the segmental level. There is no evidence of a pulmonary embolism. Heart is mildly enlarged. There are three-vessel coronary artery calcifications. No pericardial effusion. Great vessels are normal in caliber. No aortic dissection minimal aortic atherosclerosis. Mediastinum/Nodes: No neck base, axillary, mediastinal or hilar masses or pathologically enlarged lymph nodes. Trachea is patent. Endotracheal tube tip projects 13 mm above the Carina. Nasal/orogastric tube passes below the diaphragm well into the stomach. Lungs/Pleura: Small, right greater than left, pleural effusions. There is dependent airspace opacity in both lungs, right greater than left. At least a component of this is presumably atelectasis. There is additional patchy peribronchovascular type airspace consolidation, both confluent and ground-glass, and both lungs, greater on the right, as well as bilateral interstitial thickening that is most evident in the upper lobes. No pneumothorax. Upper Abdomen: No  acute abnormality. Musculoskeletal: No fracture or acute finding. No osteoblastic or osteolytic lesions. Review of the MIP images confirms the above findings. IMPRESSION: 1. No evidence of a pulmonary embolism. 2.  Dependent bilateral lung consolidation/atelectasis. Atelectasis is presumably a component of the dependent opacity, but there are findings also consistent with pneumonia, particularly on the right. In addition, there are patchy areas of confluent and ground-glass opacities as well as interstitial thickening and small bilateral pleural effusions that are consistent with pulmonary edema. 3. Mild cardiomegaly and three-vessel coronary artery calcifications. Minor aortic atherosclerosis. 4. Support apparatus is well positioned. Aortic Atherosclerosis (ICD10-I70.0). Electronically Signed   By: Amie Portlandavid  Ormond M.D.   On: April 10, 2019 14:39   Dg Chest Port 1 View  Result Date: 08/28/2018 CLINICAL DATA:  Shortness of breath, endotracheal tube placement. EXAM: PORTABLE CHEST 1 VIEW COMPARISON:  Radiograph August 28, 2018. FINDINGS: Stable cardiomediastinal silhouette. Endotracheal tube is in grossly good position. Distal tip of nasogastric tube remains within the mid esophagus. No pneumothorax is noted. Stable bibasilar atelectasis is noted with probable minimal pleural effusions. Bony thorax is unremarkable. IMPRESSION: Endotracheal tube in grossly good position. Distal tip of nasogastric tube remains within the mid esophagus and advancement is recommended. Stable bibasilar opacities as described above. Electronically Signed   By: Lupita RaiderJames  Green Jr, M.D.   On: 08/28/2018 07:08   Dg Chest Port 1 View  Result Date: 08/28/2018 CLINICAL DATA:  Acute respiratory failure EXAM: PORTABLE CHEST 1 VIEW COMPARISON:  Chest radiograph 08/27/2018 FINDINGS: ET tube mid trachea. Interval retraction enteric tube with tip terminating the midesophagus. Monitoring leads overlie the patient. Stable cardiomegaly. Minimal  heterogeneous opacities right lung base. No pleural effusion or pneumothorax. IMPRESSION: Interval retraction enteric tube now terminating in the mid esophagus. Recommend advancement. Heterogeneous opacities right lung base favored to represent atelectasis. Aspiration not excluded. These results will be called to the ordering clinician or representative by the Radiologist Assistant, and communication documented in the PACS or zVision Dashboard. Electronically Signed   By: Annia Beltrew  Davis M.D.   On: 08/28/2018 06:29   Dg Chest Port 1 View  Result Date: 08/27/2018 CLINICAL DATA:  Acute respiratory failure. EXAM: PORTABLE CHEST 1 VIEW COMPARISON:  One-view chest x-ray 08/26/2018 FINDINGS: Heart size is exaggerated by low lung volumes. Endotracheal tube is satisfactory in position, 3.5 cm above the carina. NG tube courses off the inferior border of the film. New bibasilar airspace disease is present, left greater than right. A left pleural effusion is suspected. Mild pulmonary vascular congestion is stable. IMPRESSION: 1. New left greater than right basilar airspace disease. While this may represent atelectasis, it is concerning for infection or possibly aspiration. 2. Left pleural effusion is suspected. 3. Mild pulmonary vascular congestion. 4. Support apparatus is stable. Electronically Signed   By: Marin Robertshristopher  Mattern M.D.   On: 08/27/2018 08:12   Dg Chest Port 1 View  Result Date: 08/26/2018 CLINICAL DATA:  Acute respiratory failure. EXAM: PORTABLE CHEST 1 VIEW COMPARISON:  Chest radiograph and CTA April 10, 2019 FINDINGS: Endotracheal tube appears to have been withdrawn since the prior radiograph and now terminates 3 cm above the carina. Enteric tube courses into the left upper abdomen with tip not imaged. Airspace opacities on the prior radiograph have substantially improved. Residual opacities are most notable in the right lower lobe and left upper lobe. There are likely persistent small bilateral pleural  effusions. No pneumothorax is identified. IMPRESSION: Interval improvement of bilateral airspace disease. Electronically Signed   By: Sebastian AcheAllen  Grady M.D.   On: 08/26/2018 11:38   Dg Chest Portable 1 View  Result Date: 08/28/2018 CLINICAL DATA:  Intubation. EXAM: PORTABLE CHEST 1 VIEW COMPARISON:  Chest x-ray 10/22/2012. FINDINGS: Endotracheal tube  tip noted at the level of the carina, proximal repositioning of approximately 4 cm suggested. NG tube noted with tip below left hemidiaphragm. Cardiomegaly. Diffuse bilateral pulmonary infiltrates/edema. Cardiac B-lines noted. Findings most consistent with congestive heart failure with bilateral pulmonary edema. IMPRESSION: 1. Endotracheal tube tip noted just above the carina. Proximal repositioning of approximately 4 cm suggested. NG tube noted with tip below left hemidiaphragm. 2. Findings consistent with congestive heart failure with bilateral pulmonary edema. Critical Value/emergent results were called by telephone at the time of interpretation on 09/04/2018 at 1:48 pm to Dr. Wyline Mood, who verbally acknowledged these results. Electronically Signed   By: Maisie Fus  Register   On: 09/01/2018 13:52   [place] Ekg Site Rite  Result Date: 08/27/2018 If Site Rite image not attached, placement could not be confirmed due to current cardiac rhythm.   Microbiology Recent Results (from the past 240 hour(s))  MRSA PCR Screening     Status: None   Collection Time: 08/26/2018  5:11 PM  Result Value Ref Range Status   MRSA by PCR NEGATIVE NEGATIVE Final    Comment:        The GeneXpert MRSA Assay (FDA approved for NASAL specimens only), is one component of a comprehensive MRSA colonization surveillance program. It is not intended to diagnose MRSA infection nor to guide or monitor treatment for MRSA infections. Performed at [facility], [address] [place], [place] [address]     Lab Basic Metabolic Panel: Recent Labs  Lab 09/13/2018 1619 08/24/2018 1626  08/26/18 0036 08/27/18 0447 08/28/18 0616 2018-09-10 0015  NA 135  --  137 141 139 144  K 3.8  --  3.8 3.6 3.3* 4.5  CL 105  --  104 109 110 114*  CO2 17*  --  21* 21* 21* 23  GLUCOSE 268*  --  230* 159* 155* 124*  BUN 31*  --  32* 31* 41* 48*  CREATININE 1.88* 1.89* 1.85* 1.93* 2.32* 2.68*  CALCIUM 9.4  --  9.2 8.4* 8.2* 8.3*  MG  --   --  1.6*  --  2.0 2.1  PHOS  --   --   --   --  3.1 6.7*   Liver Function Tests: Recent Labs  Lab 08/17/2018 1311 08/26/18 0036 08/28/18 0616 September 10, 2018 0015  AST 1,308* 3,875* 439* 267*  ALT 1,211* 2,263* 960* 834*  ALKPHOS 50 60 54 62  BILITOT 0.6 0.9 0.7 0.7  PROT 6.2* 5.8* 5.8* 6.4*  ALBUMIN 2.8* 2.7* 2.9* 3.2*   No results for input(s): LIPASE, AMYLASE in the last 168 hours. Recent Labs  Lab September 10, 2018 0015  AMMONIA 34   CBC: Recent Labs  Lab 08/24/2018 1311  08/24/2018 1621 08/26/18 0036 08/27/18 0447 08/27/18 1709 08/28/18 0616 09/10/18 0015  WBC 11.1*   < > 14.0* 14.1* 10.3  --  14.6* 23.4*  NEUTROABS 3.8  --   --   --  8.7*  --   --   --   HGB 8.7*   < > 8.7* 8.8* 6.7* 8.5* 8.0* 9.2*  HCT 29.4*   < > 28.6* 28.0* 21.2* 27.5* 25.4* 30.3*  MCV 94.5   < > 92.3 90.6 89.8  --  88.5 94.1  PLT 172   < > 171 177 128*  --  127* 163   < > = values in this interval not displayed.   Cardiac Enzymes: Recent Labs  Lab 08/24/2018 1311 09/14/2018 1626 09/13/2018 2041 08/26/18 0457 09/10/18 0015  TROPONINI 0.04* 0.30*  0.50* 0.37* 3.89*   Sepsis Labs: Recent Labs  Lab 08/30/2018 1619  08/26/18 0036 08/27/18 0447 08/28/18 0616 09/11/2018 0015 09/12/2018 0023  PROCALCITON 0.16  --  10.92 14.77  --   --   --   WBC  --    < > 14.1* 10.3 14.6* 23.4*  --   LATICACIDVEN  --   --   --   --   --   --  2.1*   < > = values in this interval not displayed.    Erin Fulling 09/12/2018, 7:54 AM

## 2018-09-17 NOTE — Telephone Encounter (Signed)
Laura [name] home dropped off death certificate to be signed.  Certificate in nurses box.

## 2018-09-17 NOTE — Progress Notes (Signed)
Patient continued to decline during my shift, repeat labs completed and NP Luci Bank called in family to notify them of a change. MD Marcos Eke to bedside as well to speak with the family, decision made by [facility] and other family members not to re-intubate patient at this time and to just make her comfortable. Order received for comfort care. Patient currently resting at this time with morphine infusion running. No family at the bedside at this time. Will continue to monitor patient.

## 2018-09-17 DEATH — deceased

## 2019-03-01 ENCOUNTER — Ambulatory Visit: Payer: Medicare Other | Admitting: Family Medicine

## 2019-04-06 IMAGING — DX DG CHEST 1V PORT
1 series · 1 of 1 positions shown · non-contrast
Comparison: Chest radiograph and CTA 08/25/2018

CLINICAL DATA: Acute respiratory failure.

EXAM:
PORTABLE CHEST 1 VIEW

[chest ap]
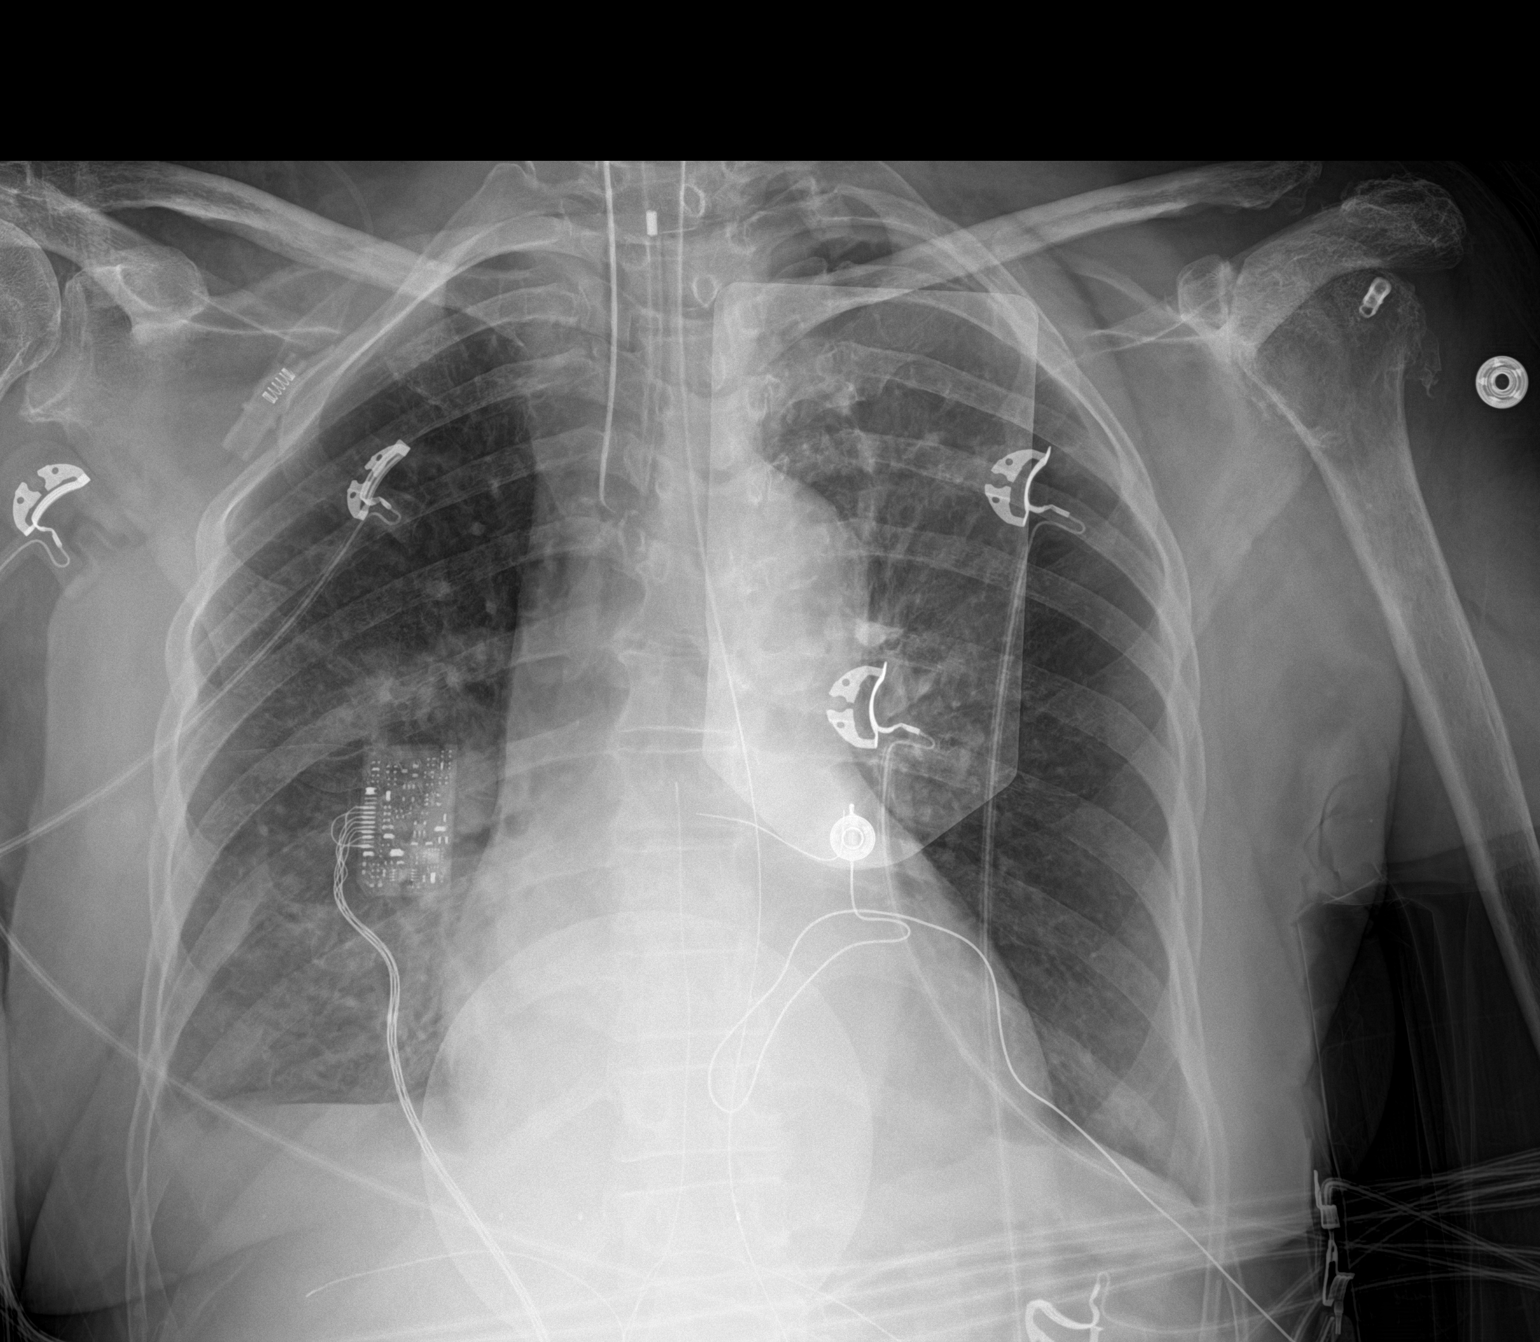

[1 of 1 positions shown; findings below may reference images not displayed]

FINDINGS: Endotracheal tube appears to have been withdrawn since the prior
radiograph and now terminates 3 cm above the carina. Enteric tube
courses into the left upper abdomen with tip not imaged. Airspace
opacities on the prior radiograph have substantially improved.
Residual opacities are most notable in the right lower lobe and left
upper lobe. There are likely persistent small bilateral pleural
effusions. No pneumothorax is identified.
IMPRESSION: Interval improvement of bilateral airspace disease.

## 2019-04-08 IMAGING — DX DG CHEST 1V PORT
1 series · 1 of 1 positions shown · non-contrast
Comparison: Radiograph August 28, 2018.

CLINICAL DATA: Shortness of breath, endotracheal tube placement.

EXAM:
PORTABLE CHEST 1 VIEW

[chest ap]
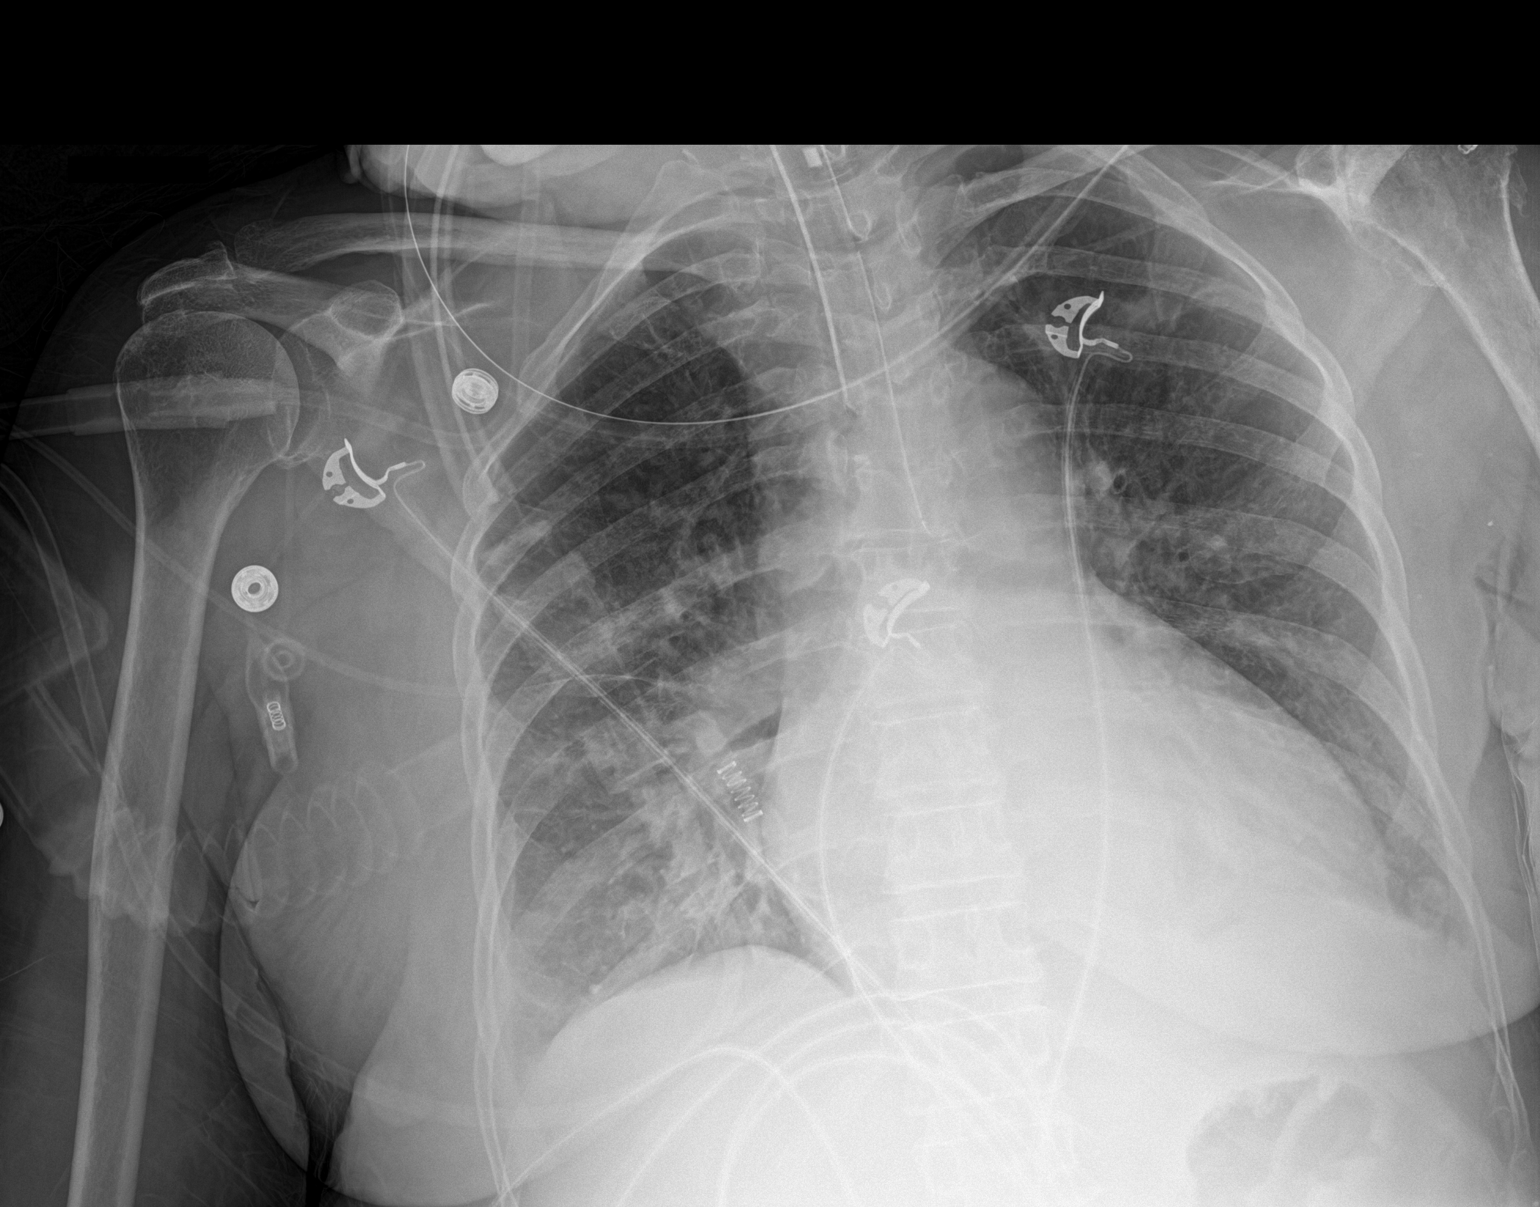

[1 of 1 positions shown; findings below may reference images not displayed]

FINDINGS: Stable cardiomediastinal silhouette. Endotracheal tube is in grossly
good position. Distal tip of nasogastric tube remains within the mid
esophagus. No pneumothorax is noted. Stable bibasilar atelectasis is
noted with probable minimal pleural effusions. Bony thorax is
unremarkable.
IMPRESSION: Endotracheal tube in grossly good position. Distal tip of
nasogastric tube remains within the mid esophagus and advancement is
recommended. Stable bibasilar opacities as described above.
